# Patient Record
Sex: Female | Born: 1955 | Race: White | Hispanic: Yes | Smoking: Never smoker
Health system: Southern US, Community
[De-identification: ages and names within clinical notes are randomized; demographics above are authoritative.]

## PROBLEM LIST (undated history)

## (undated) DIAGNOSIS — J309 Allergic rhinitis, unspecified: Secondary | ICD-10-CM

## (undated) DIAGNOSIS — E039 Hypothyroidism, unspecified: Secondary | ICD-10-CM

## (undated) DIAGNOSIS — H269 Unspecified cataract: Secondary | ICD-10-CM

## (undated) DIAGNOSIS — I700 Atherosclerosis of aorta: Secondary | ICD-10-CM

## (undated) DIAGNOSIS — M1990 Unspecified osteoarthritis, unspecified site: Secondary | ICD-10-CM

## (undated) DIAGNOSIS — T7840XA Allergy, unspecified, initial encounter: Secondary | ICD-10-CM

## (undated) DIAGNOSIS — I2510 Atherosclerotic heart disease of native coronary artery without angina pectoris: Secondary | ICD-10-CM

## (undated) DIAGNOSIS — E785 Hyperlipidemia, unspecified: Secondary | ICD-10-CM

## (undated) DIAGNOSIS — F32A Depression, unspecified: Secondary | ICD-10-CM

## (undated) DIAGNOSIS — G4730 Sleep apnea, unspecified: Secondary | ICD-10-CM

## (undated) DIAGNOSIS — K219 Gastro-esophageal reflux disease without esophagitis: Secondary | ICD-10-CM

## (undated) DIAGNOSIS — M8580 Other specified disorders of bone density and structure, unspecified site: Secondary | ICD-10-CM

## (undated) DIAGNOSIS — C50919 Malignant neoplasm of unspecified site of unspecified female breast: Secondary | ICD-10-CM

## (undated) HISTORY — DX: Hyperlipidemia, unspecified: E78.5

## (undated) HISTORY — DX: Depression, unspecified: F32.A

## (undated) HISTORY — DX: Allergic rhinitis, unspecified: J30.9

## (undated) HISTORY — PX: BREAST LUMPECTOMY: SHX2

## (undated) HISTORY — PX: OTHER SURGICAL HISTORY: SHX169

## (undated) HISTORY — DX: Atherosclerotic heart disease of native coronary artery without angina pectoris: I25.10

## (undated) HISTORY — DX: Unspecified cataract: H26.9

## (undated) HISTORY — DX: Atherosclerosis of aorta: I70.0

## (undated) HISTORY — DX: Unspecified osteoarthritis, unspecified site: M19.90

## (undated) HISTORY — DX: Allergy, unspecified, initial encounter: T78.40XA

## (undated) HISTORY — DX: Other specified disorders of bone density and structure, unspecified site: M85.80

## (undated) HISTORY — DX: Hypothyroidism, unspecified: E03.9

## (undated) HISTORY — DX: Gastro-esophageal reflux disease without esophagitis: K21.9

## (undated) HISTORY — PX: TONSILLECTOMY: SUR1361

---

## 2019-02-16 DIAGNOSIS — G5791 Unspecified mononeuropathy of right lower limb: Secondary | ICD-10-CM | POA: Diagnosis not present

## 2019-02-16 DIAGNOSIS — M19071 Primary osteoarthritis, right ankle and foot: Secondary | ICD-10-CM | POA: Diagnosis not present

## 2019-05-02 DIAGNOSIS — I8393 Asymptomatic varicose veins of bilateral lower extremities: Secondary | ICD-10-CM | POA: Diagnosis not present

## 2019-05-02 DIAGNOSIS — E039 Hypothyroidism, unspecified: Secondary | ICD-10-CM | POA: Diagnosis not present

## 2019-05-02 DIAGNOSIS — J309 Allergic rhinitis, unspecified: Secondary | ICD-10-CM | POA: Diagnosis not present

## 2019-05-02 DIAGNOSIS — G8929 Other chronic pain: Secondary | ICD-10-CM | POA: Diagnosis not present

## 2019-05-02 DIAGNOSIS — Z0000 Encounter for general adult medical examination without abnormal findings: Secondary | ICD-10-CM | POA: Diagnosis not present

## 2019-05-02 DIAGNOSIS — R69 Illness, unspecified: Secondary | ICD-10-CM | POA: Diagnosis not present

## 2019-05-02 DIAGNOSIS — Z853 Personal history of malignant neoplasm of breast: Secondary | ICD-10-CM | POA: Diagnosis not present

## 2019-05-02 DIAGNOSIS — M25511 Pain in right shoulder: Secondary | ICD-10-CM | POA: Diagnosis not present

## 2019-05-09 ENCOUNTER — Ambulatory Visit (INDEPENDENT_AMBULATORY_CARE_PROVIDER_SITE_OTHER): Payer: Medicare HMO | Admitting: Family Medicine

## 2019-05-09 ENCOUNTER — Other Ambulatory Visit: Payer: Self-pay

## 2019-05-09 ENCOUNTER — Ambulatory Visit: Payer: Self-pay

## 2019-05-09 VITALS — BP 110/80 | HR 65 | Ht 62.0 in | Wt 136.0 lb

## 2019-05-09 DIAGNOSIS — M25512 Pain in left shoulder: Secondary | ICD-10-CM

## 2019-05-09 DIAGNOSIS — M25511 Pain in right shoulder: Secondary | ICD-10-CM | POA: Diagnosis not present

## 2019-05-09 DIAGNOSIS — G8929 Other chronic pain: Secondary | ICD-10-CM | POA: Diagnosis not present

## 2019-05-09 NOTE — Patient Instructions (Signed)
Thank you for coming in today. Plan for PT.  Let me know if you have a problem.  Recheck in 6 weeks.  Return or contact me sooner if needed.  Do the home exercises.   [place].

## 2019-05-09 NOTE — Progress Notes (Signed)
Subjective:    I'm seeing this patient as a consultation for: Kayla Pretty, MD  . Note will be routed back to referring provider/PCP.  CC: bilteral shoulder pain   HPI: Patient is a 64 year old female presenting today with bilateral shoulder pain X1 year. She rates her pain 8/10 and describes the pain as sharp in nature. Patient locates the pain to lateral shoulders. States in the past she has been told that she has had rotator cuff tears.  She has had injections and done physical therapy in the past as well which helped some.  She was doing Mueller well until recently.  Patient moved here from [place] so has been doing a lot of lifting.   Radiating pain: no  R UE numbness/tingling: yes when sleeping   R UE weakness: no  Aggravating factors: raising arm  Treatments tried: Ibuprofen   Past medical history, Surgical history, Family history, Social history, Allergies, and medications have been entered into the medical record, reviewed.   Review of Systems: No new headache, visual changes, nausea, vomiting, diarrhea, constipation, dizziness, abdominal pain, skin rash, fevers, chills, night sweats, weight loss, swollen lymph nodes, body aches, joint swelling, muscle aches, chest pain, shortness of breath, mood changes, visual or auditory hallucinations.   Objective:    Vitals:   05/09/19 1456  BP: 110/80  Pulse: 65  SpO2: 97%   General: Well Developed, well nourished, and in no acute distress.  Neuro/Psych: Alert and oriented x3, extra-ocular muscles intact, able to move all 4 extremities, sensation grossly intact. Skin: Warm and dry, no rashes noted.  Respiratory: Not using accessory muscles, speaking in full sentences, trachea midline.  Cardiovascular: Pulses palpable, no extremity edema. Abdomen: Does not appear distended. MSK:  Right shoulder: Normal-appearing Nontender. Range of motion abduction limited to 100 degrees.  External rotation full.  Internal rotation to lumbar  spine. Strength abduction 5/5 external rotation 5/5 internal rotation 5/5. Positive Hawkins and Neer's test. Negative Yergason's and speeds test.  Left shoulder: Normal-appearing Nontender. Range of motion abduction limited to 110 degrees.  Full external rotation.  Internal rotation limited to lumbar spine. Strength full throughout. Positive Hawkins and Neer's test. Negative Yergason's and speeds test.  Pulses cap refill and sensation are intact distally.  Lab and Radiology Results  Procedure: Real-time Ultrasound Guided Injection of right shoulder subacromial bursa Device: Philips Affiniti 50G Images permanently stored and available for review in the ultrasound unit. Verbal informed consent obtained.  Discussed risks and benefits of procedure. Warned about infection bleeding damage to structures skin hypopigmentation and fat atrophy among others. Patient expresses understanding and agreement Time-out conducted.   Noted no overlying erythema, induration, or other signs of local infection.   Skin prepped in a sterile fashion.   Local anesthesia: Topical Ethyl chloride.   Supraspinatus tendon is intact without full-thickness tear With sterile technique and under real time ultrasound guidance:  40 mg of Kenalog and 2 mL of Marcaine injected easily.   Completed without difficulty   Pain immediately resolved suggesting accurate placement of the medication.   Advised to call if fevers/chills, erythema, induration, drainage, or persistent bleeding.   Images permanently stored and available for review in the ultrasound unit.  Impression: Technically successful ultrasound guided injection.   Procedure: Real-time Ultrasound Guided Injection of left shoulder subacromial bursa Device: Philips Affiniti 50G Images permanently stored and available for review in the ultrasound unit. Verbal informed consent obtained.  Discussed risks and benefits of procedure. Warned about infection bleeding  damage to structures skin hypopigmentation and fat atrophy among others. Patient expresses understanding and agreement Time-out conducted.   Noted no overlying erythema, induration, or other signs of local infection.   Supraspinatus tendon is intact without evidence of full-thickness tear Skin prepped in a sterile fashion.   Local anesthesia: Topical Ethyl chloride.   With sterile technique and under real time ultrasound guidance:  40 mg of Kenalog and 2 mL of Marcaine injected easily.   Completed without difficulty   Pain immediately resolved suggesting accurate placement of the medication.   Advised to call if fevers/chills, erythema, induration, drainage, or persistent bleeding.   Images permanently stored and available for review in the ultrasound unit.  Impression: Technically successful ultrasound guided injection.      Impression and Recommendations:    Assessment and Plan: 64 y.o. female with bilateral shoulder pain due to rotator cuff tendinopathy.  Chronic recurrent problem first time seeing patient for this issue.  Plan for injection as above.  Also will refer back to physical therapy.  She has had some benefit in the past with this and needs effectively a tune up.  Recommend continued ongoing home exercise program as well.  Check back in 6 weeks.  Return sooner if needed.   Orders Placed This Encounter  Procedures  . [place] LIMITED JOINT SPACE STRUCTURES UP BILAT(NO LINKED CHARGES)    Order Specific Question:   Reason for Exam (SYMPTOM  OR DIAGNOSIS REQUIRED)    Answer:   shoulder pain b    Order Specific Question:   Preferred imaging location?    Answer:   [place]  . Ambulatory referral to Physical Therapy    Referral Priority:   Routine    Referral Type:   Physical Medicine    Referral Reason:   Specialty Services Required    Requested Specialty:   Physical Therapy    Number of Visits Requested:   1   No orders of the defined types were  placed in this encounter.   Discussed warning signs or symptoms. Please see discharge instructions. Patient expresses understanding.   The above documentation has been reviewed and is accurate and complete [name]   Will send note to PCP Dr [facility] [address] [place], [place] [address] Phone: [phone] Fax: [phone]

## 2019-05-17 DIAGNOSIS — M25612 Stiffness of left shoulder, not elsewhere classified: Secondary | ICD-10-CM | POA: Diagnosis not present

## 2019-05-17 DIAGNOSIS — M25611 Stiffness of right shoulder, not elsewhere classified: Secondary | ICD-10-CM | POA: Diagnosis not present

## 2019-05-17 DIAGNOSIS — M25511 Pain in right shoulder: Secondary | ICD-10-CM | POA: Diagnosis not present

## 2019-05-17 DIAGNOSIS — M25512 Pain in left shoulder: Secondary | ICD-10-CM | POA: Diagnosis not present

## 2019-05-20 DIAGNOSIS — M62512 Muscle wasting and atrophy, not elsewhere classified, left shoulder: Secondary | ICD-10-CM | POA: Diagnosis not present

## 2019-05-20 DIAGNOSIS — M25512 Pain in left shoulder: Secondary | ICD-10-CM | POA: Diagnosis not present

## 2019-05-20 DIAGNOSIS — M62511 Muscle wasting and atrophy, not elsewhere classified, right shoulder: Secondary | ICD-10-CM | POA: Diagnosis not present

## 2019-05-20 DIAGNOSIS — M25611 Stiffness of right shoulder, not elsewhere classified: Secondary | ICD-10-CM | POA: Diagnosis not present

## 2019-05-20 DIAGNOSIS — M25612 Stiffness of left shoulder, not elsewhere classified: Secondary | ICD-10-CM | POA: Diagnosis not present

## 2019-05-20 DIAGNOSIS — M25511 Pain in right shoulder: Secondary | ICD-10-CM | POA: Diagnosis not present

## 2019-05-24 DIAGNOSIS — M62511 Muscle wasting and atrophy, not elsewhere classified, right shoulder: Secondary | ICD-10-CM | POA: Diagnosis not present

## 2019-05-24 DIAGNOSIS — M25511 Pain in right shoulder: Secondary | ICD-10-CM | POA: Diagnosis not present

## 2019-05-24 DIAGNOSIS — M25612 Stiffness of left shoulder, not elsewhere classified: Secondary | ICD-10-CM | POA: Diagnosis not present

## 2019-05-24 DIAGNOSIS — M25611 Stiffness of right shoulder, not elsewhere classified: Secondary | ICD-10-CM | POA: Diagnosis not present

## 2019-05-24 DIAGNOSIS — M62512 Muscle wasting and atrophy, not elsewhere classified, left shoulder: Secondary | ICD-10-CM | POA: Diagnosis not present

## 2019-05-24 DIAGNOSIS — M25512 Pain in left shoulder: Secondary | ICD-10-CM | POA: Diagnosis not present

## 2019-05-26 DIAGNOSIS — M25512 Pain in left shoulder: Secondary | ICD-10-CM | POA: Diagnosis not present

## 2019-05-26 DIAGNOSIS — M25611 Stiffness of right shoulder, not elsewhere classified: Secondary | ICD-10-CM | POA: Diagnosis not present

## 2019-05-26 DIAGNOSIS — M25612 Stiffness of left shoulder, not elsewhere classified: Secondary | ICD-10-CM | POA: Diagnosis not present

## 2019-05-26 DIAGNOSIS — M62511 Muscle wasting and atrophy, not elsewhere classified, right shoulder: Secondary | ICD-10-CM | POA: Diagnosis not present

## 2019-05-26 DIAGNOSIS — M25511 Pain in right shoulder: Secondary | ICD-10-CM | POA: Diagnosis not present

## 2019-05-26 DIAGNOSIS — M62512 Muscle wasting and atrophy, not elsewhere classified, left shoulder: Secondary | ICD-10-CM | POA: Diagnosis not present

## 2019-05-31 DIAGNOSIS — M25512 Pain in left shoulder: Secondary | ICD-10-CM | POA: Diagnosis not present

## 2019-05-31 DIAGNOSIS — M62511 Muscle wasting and atrophy, not elsewhere classified, right shoulder: Secondary | ICD-10-CM | POA: Diagnosis not present

## 2019-05-31 DIAGNOSIS — M25612 Stiffness of left shoulder, not elsewhere classified: Secondary | ICD-10-CM | POA: Diagnosis not present

## 2019-05-31 DIAGNOSIS — M62512 Muscle wasting and atrophy, not elsewhere classified, left shoulder: Secondary | ICD-10-CM | POA: Diagnosis not present

## 2019-05-31 DIAGNOSIS — M25511 Pain in right shoulder: Secondary | ICD-10-CM | POA: Diagnosis not present

## 2019-05-31 DIAGNOSIS — M25611 Stiffness of right shoulder, not elsewhere classified: Secondary | ICD-10-CM | POA: Diagnosis not present

## 2019-06-07 DIAGNOSIS — M25612 Stiffness of left shoulder, not elsewhere classified: Secondary | ICD-10-CM | POA: Diagnosis not present

## 2019-06-07 DIAGNOSIS — M25512 Pain in left shoulder: Secondary | ICD-10-CM | POA: Diagnosis not present

## 2019-06-07 DIAGNOSIS — M62512 Muscle wasting and atrophy, not elsewhere classified, left shoulder: Secondary | ICD-10-CM | POA: Diagnosis not present

## 2019-06-07 DIAGNOSIS — M25511 Pain in right shoulder: Secondary | ICD-10-CM | POA: Diagnosis not present

## 2019-06-07 DIAGNOSIS — R69 Illness, unspecified: Secondary | ICD-10-CM | POA: Diagnosis not present

## 2019-06-07 DIAGNOSIS — M62511 Muscle wasting and atrophy, not elsewhere classified, right shoulder: Secondary | ICD-10-CM | POA: Diagnosis not present

## 2019-06-07 DIAGNOSIS — M25611 Stiffness of right shoulder, not elsewhere classified: Secondary | ICD-10-CM | POA: Diagnosis not present

## 2019-06-09 DIAGNOSIS — M62512 Muscle wasting and atrophy, not elsewhere classified, left shoulder: Secondary | ICD-10-CM | POA: Diagnosis not present

## 2019-06-09 DIAGNOSIS — M62511 Muscle wasting and atrophy, not elsewhere classified, right shoulder: Secondary | ICD-10-CM | POA: Diagnosis not present

## 2019-06-09 DIAGNOSIS — M25612 Stiffness of left shoulder, not elsewhere classified: Secondary | ICD-10-CM | POA: Diagnosis not present

## 2019-06-09 DIAGNOSIS — M25512 Pain in left shoulder: Secondary | ICD-10-CM | POA: Diagnosis not present

## 2019-06-09 DIAGNOSIS — M25511 Pain in right shoulder: Secondary | ICD-10-CM | POA: Diagnosis not present

## 2019-06-09 DIAGNOSIS — M25611 Stiffness of right shoulder, not elsewhere classified: Secondary | ICD-10-CM | POA: Diagnosis not present

## 2019-06-13 DIAGNOSIS — Z1211 Encounter for screening for malignant neoplasm of colon: Secondary | ICD-10-CM | POA: Diagnosis not present

## 2019-06-13 DIAGNOSIS — Z1212 Encounter for screening for malignant neoplasm of rectum: Secondary | ICD-10-CM | POA: Diagnosis not present

## 2019-06-14 DIAGNOSIS — M62512 Muscle wasting and atrophy, not elsewhere classified, left shoulder: Secondary | ICD-10-CM | POA: Diagnosis not present

## 2019-06-14 DIAGNOSIS — M62511 Muscle wasting and atrophy, not elsewhere classified, right shoulder: Secondary | ICD-10-CM | POA: Diagnosis not present

## 2019-06-14 DIAGNOSIS — M25612 Stiffness of left shoulder, not elsewhere classified: Secondary | ICD-10-CM | POA: Diagnosis not present

## 2019-06-14 DIAGNOSIS — M25611 Stiffness of right shoulder, not elsewhere classified: Secondary | ICD-10-CM | POA: Diagnosis not present

## 2019-06-14 DIAGNOSIS — M25512 Pain in left shoulder: Secondary | ICD-10-CM | POA: Diagnosis not present

## 2019-06-14 DIAGNOSIS — M25511 Pain in right shoulder: Secondary | ICD-10-CM | POA: Diagnosis not present

## 2019-06-20 ENCOUNTER — Ambulatory Visit: Payer: Medicare HMO | Admitting: Family Medicine

## 2019-06-20 DIAGNOSIS — M25612 Stiffness of left shoulder, not elsewhere classified: Secondary | ICD-10-CM | POA: Diagnosis not present

## 2019-06-20 DIAGNOSIS — M25512 Pain in left shoulder: Secondary | ICD-10-CM | POA: Diagnosis not present

## 2019-06-20 DIAGNOSIS — M62511 Muscle wasting and atrophy, not elsewhere classified, right shoulder: Secondary | ICD-10-CM | POA: Diagnosis not present

## 2019-06-20 DIAGNOSIS — M25611 Stiffness of right shoulder, not elsewhere classified: Secondary | ICD-10-CM | POA: Diagnosis not present

## 2019-06-20 DIAGNOSIS — M25511 Pain in right shoulder: Secondary | ICD-10-CM | POA: Diagnosis not present

## 2019-06-20 DIAGNOSIS — M62512 Muscle wasting and atrophy, not elsewhere classified, left shoulder: Secondary | ICD-10-CM | POA: Diagnosis not present

## 2019-06-22 DIAGNOSIS — M25511 Pain in right shoulder: Secondary | ICD-10-CM | POA: Diagnosis not present

## 2019-06-22 DIAGNOSIS — M62512 Muscle wasting and atrophy, not elsewhere classified, left shoulder: Secondary | ICD-10-CM | POA: Diagnosis not present

## 2019-06-22 DIAGNOSIS — M25512 Pain in left shoulder: Secondary | ICD-10-CM | POA: Diagnosis not present

## 2019-06-22 DIAGNOSIS — M25612 Stiffness of left shoulder, not elsewhere classified: Secondary | ICD-10-CM | POA: Diagnosis not present

## 2019-06-22 DIAGNOSIS — M25611 Stiffness of right shoulder, not elsewhere classified: Secondary | ICD-10-CM | POA: Diagnosis not present

## 2019-06-22 DIAGNOSIS — M62511 Muscle wasting and atrophy, not elsewhere classified, right shoulder: Secondary | ICD-10-CM | POA: Diagnosis not present

## 2019-06-24 ENCOUNTER — Other Ambulatory Visit: Payer: Self-pay

## 2019-06-24 ENCOUNTER — Ambulatory Visit: Payer: Medicare HMO | Admitting: Family Medicine

## 2019-06-24 ENCOUNTER — Encounter: Payer: Self-pay | Admitting: Family Medicine

## 2019-06-24 ENCOUNTER — Ambulatory Visit (INDEPENDENT_AMBULATORY_CARE_PROVIDER_SITE_OTHER)
Admission: RE | Admit: 2019-06-24 | Discharge: 2019-06-24 | Disposition: A | Discharge status: Home / Self Care | Payer: Medicare HMO | Source: Ambulatory Visit | Attending: Family Medicine | Admitting: Family Medicine

## 2019-06-24 VITALS — BP 110/72 | HR 68 | Ht 62.0 in | Wt 134.0 lb

## 2019-06-24 DIAGNOSIS — G8929 Other chronic pain: Secondary | ICD-10-CM

## 2019-06-24 DIAGNOSIS — M25512 Pain in left shoulder: Secondary | ICD-10-CM

## 2019-06-24 DIAGNOSIS — M25511 Pain in right shoulder: Secondary | ICD-10-CM | POA: Diagnosis not present

## 2019-06-24 DIAGNOSIS — M19011 Primary osteoarthritis, right shoulder: Secondary | ICD-10-CM | POA: Diagnosis not present

## 2019-06-24 DIAGNOSIS — M19012 Primary osteoarthritis, left shoulder: Secondary | ICD-10-CM | POA: Diagnosis not present

## 2019-06-24 NOTE — Progress Notes (Signed)
X-ray left shoulder shows medium arthritis of the shoulder joint and a little bit of arthritis at the top of the shoulder joint.

## 2019-06-24 NOTE — Progress Notes (Signed)
X-ray right shoulder does show some mild main shoulder joint arthritis and some arthritis at the joint and the top of the shoulder as well.

## 2019-06-24 NOTE — Progress Notes (Signed)
Rito Ehrlich, am serving as a Education administrator for Dr. [name].  Kayla Mueller is a 64 y.o. female who presents to [place] at [facility] today for f/u of B shoulder pain x one year.  She was last seen by Dr. Georgina Snell on 05/09/19 and had B subacromial injections.  She was referred to PT at [facility].  Since her last visit, pt reports she was doing really well, has been going to PT twice a week and it has been really working and the injections from last visit helped a lot. Patient states she has started to feel the pain coming back .  Right shoulder hurts worse than left.   Pertinent review of systems: No fevers or chills  Relevant historical information: Hypothyroidism   Exam:  BP 110/72 (BP Location: Left Arm, Patient Position: Sitting, Cuff Size: Normal)   Pulse 68   Ht 5\' 2"  (1.575 m)   Wt 134 lb (60.8 kg)   SpO2 98%   BMI 24.51 kg/m  General: Well Developed, well nourished, and in no acute distress.   MSK: Right shoulder normal-appearing Nontender. Normal motion. Strength abduction 4+/5.  External rotation 4+/5.  Internal rotation 5/5. Positive Hawkins and Neer's test and positive empty can test. Negative Yergason's and speeds test  Left shoulder normal-appearing Nontender. Normal motion. Intact strength. Negative Hawkins and Neer's test.  Negative empty can test. Negative Yergason's and speeds test.    Lab and Radiology Results  X-ray images bilateral shoulders obtained today personally and independently reviewed  Left shoulder: Moderate AC DJD.  Mild glenohumeral DJD.  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  Minimal glenohumeral DJD.  No acute fractures.  Await formal radiology review    Assessment and Plan: 64 y.o. female with bilateral shoulder pain right worse than left.  Significantly improved following subacromial injection and physical therapy.  Plan to continue physical therapy and home exercise program.   Obtain x-rays today to assess for bony impingement and arthritis and other causes of pain.  Check back in a month if not better would either reinject the shoulders or consider MRI for potential surgical or other injection planning.   PDMP not reviewed this encounter. Orders Placed This Encounter  Procedures  . DG Shoulder Right    Standing Status:   Future    Standing Expiration Date:   06/23/2020    Order Specific Question:   Reason for Exam (SYMPTOM  OR DIAGNOSIS REQUIRED)    Answer:   eval BL shoulder pain    Order Specific Question:   Preferred imaging location?    Answer:   [facility]    Order Specific Question:   Radiology Contrast Protocol - do NOT remove file path    Answer:   \\[email or web address]s.pdf  . DG Shoulder Left    Standing Status:   Future    Standing Expiration Date:   06/23/2020    Order Specific Question:   Reason for Exam (SYMPTOM  OR DIAGNOSIS REQUIRED)    Answer:   eval Bl shoulder pain    Order Specific Question:   Preferred imaging location?    Answer:   [facility]    Order Specific Question:   Radiology Contrast Protocol - do NOT remove file path    Answer:   \\[email or web address]\Radiant\DXFluoroContrastProtocols.pdf   No orders of the defined types were placed in this encounter.    Discussed warning signs or symptoms. Please see discharge instructions. Patient expresses understanding.  The above documentation has been reviewed and is accurate and complete [name], M.D.    Total encounter time 30 minutes including charting time date of service. ddx and next steps

## 2019-06-24 NOTE — Patient Instructions (Signed)
Thank you for coming in today. Plan for xrays.  Continue home exercises and PT.  Recheck in about 1 month.  Can repeat injection if needed.  If just not better could proceed to MRI for surgical planning and potential next step planning.    Shoulder Impingement Syndrome  Shoulder impingement syndrome is a condition that causes pain when connective tissues (tendons) surrounding the shoulder joint become pinched. These tendons are part of the group of muscles and tissues that help to stabilize the shoulder (rotator cuff). Beneath the rotator cuff is a fluid-filled sac (bursa) that allows the muscles and tendons to glide smoothly. The bursa may become swollen or irritated (bursitis). Bursitis, swelling in the rotator cuff tendons, or both conditions can decrease how much space is under a bone in the shoulder joint (acromion), resulting in impingement. What are the causes? Shoulder impingement syndrome may be caused by bursitis or swelling of the rotator cuff tendons, which may result from:  Repetitive overhead arm movements.  Falling onto the shoulder.  Weakness in the shoulder muscles. What increases the risk? You may be more likely to develop this condition if you:  Play sports that involve throwing, such as baseball.  Participate in sports such as tennis, volleyball, and swimming.  Work as a Curator, Games developer, or Architect. Some people are also more likely to develop impingement syndrome because of the shape of their acromion bone. What are the signs or symptoms? The main symptom of this condition is pain on the front or side of the shoulder. The pain may:  Get worse when lifting or raising the arm.  Get worse at night.  Wake you up from sleeping.  Feel sharp when the shoulder is moved and then fade to an ache. Other symptoms may include:  Tenderness.  Stiffness.  Inability to raise the arm above shoulder level or behind the body.  Weakness. How is this  diagnosed? This condition may be diagnosed based on:  Your symptoms and medical history.  A physical exam.  Imaging tests, such as: ? X-rays. ? MRI. ? Ultrasound. How is this treated? This condition may be treated by:  Resting your shoulder and avoiding all activities that cause pain or put stress on the shoulder.  Icing your shoulder.  NSAIDs to help reduce pain and swelling.  One or more injections of medicines to numb the area and reduce inflammation.  Physical therapy.  Surgery. This may be needed if nonsurgical treatments have not helped. Surgery may involve repairing the rotator cuff, reshaping the acromion, or removing the bursa. Follow these instructions at home: Managing pain, stiffness, and swelling   If directed, put ice on the injured area. ? Put ice in a plastic bag. ? Place a towel between your skin and the bag. ? Leave the ice on for 20 minutes, 2-3 times a day. Activity  Rest and return to your normal activities as told by your health care provider. Ask your health care provider what activities are safe for you.  Do exercises as told by your health care provider. General instructions  Do not use any products that contain nicotine or tobacco, such as cigarettes, e-cigarettes, and chewing tobacco. These can delay healing. If you need help quitting, ask your health care provider.  Ask your health care provider when it is safe for you to drive.  Take over-the-counter and prescription medicines only as told by your health care provider.  Keep all follow-up visits as told by your health care provider. This  is important. How is this prevented?  Give your body time to rest between periods of activity.  Be safe and responsible while being active. This will help you avoid falls.  Maintain physical fitness, including strength and flexibility. Contact a health care provider if:  Your symptoms have not improved after 1-2 months of treatment and rest.  You  cannot lift your arm away from your body. Summary  Shoulder impingement syndrome is a condition that causes pain when connective tissues (tendons) surrounding the shoulder joint become pinched.  The main symptom of this condition is pain on the front or side of the shoulder.  This condition is usually treated with rest, ice, and pain medicines as needed. This information is not intended to replace advice given to you by your health care provider. Make sure you discuss any questions you have with your health care provider. Document Revised: 05/14/2018 Document Reviewed: 07/15/2017 Elsevier Patient Education  2020 [employer].

## 2019-06-28 DIAGNOSIS — M25612 Stiffness of left shoulder, not elsewhere classified: Secondary | ICD-10-CM | POA: Diagnosis not present

## 2019-06-28 DIAGNOSIS — M25511 Pain in right shoulder: Secondary | ICD-10-CM | POA: Diagnosis not present

## 2019-06-28 DIAGNOSIS — M25611 Stiffness of right shoulder, not elsewhere classified: Secondary | ICD-10-CM | POA: Diagnosis not present

## 2019-06-28 DIAGNOSIS — M62511 Muscle wasting and atrophy, not elsewhere classified, right shoulder: Secondary | ICD-10-CM | POA: Diagnosis not present

## 2019-06-28 DIAGNOSIS — M25512 Pain in left shoulder: Secondary | ICD-10-CM | POA: Diagnosis not present

## 2019-06-28 DIAGNOSIS — M62512 Muscle wasting and atrophy, not elsewhere classified, left shoulder: Secondary | ICD-10-CM | POA: Diagnosis not present

## 2019-06-30 DIAGNOSIS — M62511 Muscle wasting and atrophy, not elsewhere classified, right shoulder: Secondary | ICD-10-CM | POA: Diagnosis not present

## 2019-06-30 DIAGNOSIS — M25512 Pain in left shoulder: Secondary | ICD-10-CM | POA: Diagnosis not present

## 2019-06-30 DIAGNOSIS — M25612 Stiffness of left shoulder, not elsewhere classified: Secondary | ICD-10-CM | POA: Diagnosis not present

## 2019-06-30 DIAGNOSIS — M25611 Stiffness of right shoulder, not elsewhere classified: Secondary | ICD-10-CM | POA: Diagnosis not present

## 2019-06-30 DIAGNOSIS — M25511 Pain in right shoulder: Secondary | ICD-10-CM | POA: Diagnosis not present

## 2019-06-30 DIAGNOSIS — M62512 Muscle wasting and atrophy, not elsewhere classified, left shoulder: Secondary | ICD-10-CM | POA: Diagnosis not present

## 2019-07-05 DIAGNOSIS — M62511 Muscle wasting and atrophy, not elsewhere classified, right shoulder: Secondary | ICD-10-CM | POA: Diagnosis not present

## 2019-07-05 DIAGNOSIS — M25512 Pain in left shoulder: Secondary | ICD-10-CM | POA: Diagnosis not present

## 2019-07-05 DIAGNOSIS — M62512 Muscle wasting and atrophy, not elsewhere classified, left shoulder: Secondary | ICD-10-CM | POA: Diagnosis not present

## 2019-07-05 DIAGNOSIS — M25612 Stiffness of left shoulder, not elsewhere classified: Secondary | ICD-10-CM | POA: Diagnosis not present

## 2019-07-05 DIAGNOSIS — M25511 Pain in right shoulder: Secondary | ICD-10-CM | POA: Diagnosis not present

## 2019-07-05 DIAGNOSIS — M25611 Stiffness of right shoulder, not elsewhere classified: Secondary | ICD-10-CM | POA: Diagnosis not present

## 2019-07-07 DIAGNOSIS — M62511 Muscle wasting and atrophy, not elsewhere classified, right shoulder: Secondary | ICD-10-CM | POA: Diagnosis not present

## 2019-07-07 DIAGNOSIS — M25612 Stiffness of left shoulder, not elsewhere classified: Secondary | ICD-10-CM | POA: Diagnosis not present

## 2019-07-07 DIAGNOSIS — M62512 Muscle wasting and atrophy, not elsewhere classified, left shoulder: Secondary | ICD-10-CM | POA: Diagnosis not present

## 2019-07-07 DIAGNOSIS — M25611 Stiffness of right shoulder, not elsewhere classified: Secondary | ICD-10-CM | POA: Diagnosis not present

## 2019-07-07 DIAGNOSIS — M25512 Pain in left shoulder: Secondary | ICD-10-CM | POA: Diagnosis not present

## 2019-07-07 DIAGNOSIS — M25511 Pain in right shoulder: Secondary | ICD-10-CM | POA: Diagnosis not present

## 2019-07-15 DIAGNOSIS — M62511 Muscle wasting and atrophy, not elsewhere classified, right shoulder: Secondary | ICD-10-CM | POA: Diagnosis not present

## 2019-07-15 DIAGNOSIS — M25612 Stiffness of left shoulder, not elsewhere classified: Secondary | ICD-10-CM | POA: Diagnosis not present

## 2019-07-15 DIAGNOSIS — M25611 Stiffness of right shoulder, not elsewhere classified: Secondary | ICD-10-CM | POA: Diagnosis not present

## 2019-07-15 DIAGNOSIS — M25511 Pain in right shoulder: Secondary | ICD-10-CM | POA: Diagnosis not present

## 2019-07-15 DIAGNOSIS — M62512 Muscle wasting and atrophy, not elsewhere classified, left shoulder: Secondary | ICD-10-CM | POA: Diagnosis not present

## 2019-07-15 DIAGNOSIS — M25512 Pain in left shoulder: Secondary | ICD-10-CM | POA: Diagnosis not present

## 2019-07-19 DIAGNOSIS — M25612 Stiffness of left shoulder, not elsewhere classified: Secondary | ICD-10-CM | POA: Diagnosis not present

## 2019-07-19 DIAGNOSIS — M25512 Pain in left shoulder: Secondary | ICD-10-CM | POA: Diagnosis not present

## 2019-07-19 DIAGNOSIS — M25511 Pain in right shoulder: Secondary | ICD-10-CM | POA: Diagnosis not present

## 2019-07-19 DIAGNOSIS — M25611 Stiffness of right shoulder, not elsewhere classified: Secondary | ICD-10-CM | POA: Diagnosis not present

## 2019-07-19 DIAGNOSIS — M62512 Muscle wasting and atrophy, not elsewhere classified, left shoulder: Secondary | ICD-10-CM | POA: Diagnosis not present

## 2019-07-19 DIAGNOSIS — M62511 Muscle wasting and atrophy, not elsewhere classified, right shoulder: Secondary | ICD-10-CM | POA: Diagnosis not present

## 2019-07-21 DIAGNOSIS — M62511 Muscle wasting and atrophy, not elsewhere classified, right shoulder: Secondary | ICD-10-CM | POA: Diagnosis not present

## 2019-07-21 DIAGNOSIS — M25512 Pain in left shoulder: Secondary | ICD-10-CM | POA: Diagnosis not present

## 2019-07-21 DIAGNOSIS — M25611 Stiffness of right shoulder, not elsewhere classified: Secondary | ICD-10-CM | POA: Diagnosis not present

## 2019-07-21 DIAGNOSIS — M25612 Stiffness of left shoulder, not elsewhere classified: Secondary | ICD-10-CM | POA: Diagnosis not present

## 2019-07-21 DIAGNOSIS — M25511 Pain in right shoulder: Secondary | ICD-10-CM | POA: Diagnosis not present

## 2019-07-21 DIAGNOSIS — M62512 Muscle wasting and atrophy, not elsewhere classified, left shoulder: Secondary | ICD-10-CM | POA: Diagnosis not present

## 2019-07-25 ENCOUNTER — Ambulatory Visit: Payer: Medicare HMO | Admitting: Family Medicine

## 2019-07-26 DIAGNOSIS — M25611 Stiffness of right shoulder, not elsewhere classified: Secondary | ICD-10-CM | POA: Diagnosis not present

## 2019-07-26 DIAGNOSIS — M62512 Muscle wasting and atrophy, not elsewhere classified, left shoulder: Secondary | ICD-10-CM | POA: Diagnosis not present

## 2019-07-26 DIAGNOSIS — M25511 Pain in right shoulder: Secondary | ICD-10-CM | POA: Diagnosis not present

## 2019-07-26 DIAGNOSIS — M25612 Stiffness of left shoulder, not elsewhere classified: Secondary | ICD-10-CM | POA: Diagnosis not present

## 2019-07-26 DIAGNOSIS — M62511 Muscle wasting and atrophy, not elsewhere classified, right shoulder: Secondary | ICD-10-CM | POA: Diagnosis not present

## 2019-07-26 DIAGNOSIS — M25512 Pain in left shoulder: Secondary | ICD-10-CM | POA: Diagnosis not present

## 2019-07-27 ENCOUNTER — Ambulatory Visit: Payer: Medicare HMO | Admitting: Family Medicine

## 2019-07-29 DIAGNOSIS — M25612 Stiffness of left shoulder, not elsewhere classified: Secondary | ICD-10-CM | POA: Diagnosis not present

## 2019-07-29 DIAGNOSIS — M62512 Muscle wasting and atrophy, not elsewhere classified, left shoulder: Secondary | ICD-10-CM | POA: Diagnosis not present

## 2019-07-29 DIAGNOSIS — M25611 Stiffness of right shoulder, not elsewhere classified: Secondary | ICD-10-CM | POA: Diagnosis not present

## 2019-07-29 DIAGNOSIS — M62511 Muscle wasting and atrophy, not elsewhere classified, right shoulder: Secondary | ICD-10-CM | POA: Diagnosis not present

## 2019-07-29 DIAGNOSIS — M25512 Pain in left shoulder: Secondary | ICD-10-CM | POA: Diagnosis not present

## 2019-07-29 DIAGNOSIS — M25511 Pain in right shoulder: Secondary | ICD-10-CM | POA: Diagnosis not present

## 2019-08-01 ENCOUNTER — Ambulatory Visit: Payer: Medicare HMO | Admitting: Family Medicine

## 2019-08-01 ENCOUNTER — Other Ambulatory Visit: Payer: Self-pay

## 2019-08-01 ENCOUNTER — Ambulatory Visit: Payer: Self-pay

## 2019-08-01 VITALS — BP 120/70 | HR 69 | Ht 62.0 in | Wt 137.6 lb

## 2019-08-01 DIAGNOSIS — G8929 Other chronic pain: Secondary | ICD-10-CM | POA: Diagnosis not present

## 2019-08-01 DIAGNOSIS — M25512 Pain in left shoulder: Secondary | ICD-10-CM | POA: Diagnosis not present

## 2019-08-01 DIAGNOSIS — M25511 Pain in right shoulder: Secondary | ICD-10-CM | POA: Diagnosis not present

## 2019-08-01 NOTE — Patient Instructions (Signed)
Thank you for coming in today. Plan for injection today and MRI right shoulder soon.  After the MRI either schedule with me to go over it or I can refer directly to surgery.   Call or go to the ER if you develop a large red swollen joint with extreme pain or oozing puss.

## 2019-08-01 NOTE — Progress Notes (Signed)
I, Wendy Poet, LAT, ATC, am serving as scribe for Dr. [name].  Kayla Mueller is a 64 y.o. female who presents to [place] at [facility] today for f/u of B shoulder pain, R>L.  She was last seen by Dr. Georgina Snell on 06/24/19 and was advised to con't her outpatient PT and f/u again in one month.  She had prior B subacromial injections on 05/09/19.  Since her last visit, pt reports that her L shoulder is pretty severe but notes that her R and L shoulder are hurting about the same.  She states that she did PT x 2 months and has been discharged.  She is having radiating pain into her B upper arms.  She is right-hand dominant and her right shoulder hurts worse than her left.  Diagnostic imaging: B shoulder XR- 06/24/19  Pertinent review of systems: No fevers or chills  Relevant historical information: Hypothyroidism   Exam:  BP 120/70 (BP Location: Left Arm, Patient Position: Sitting, Cuff Size: Normal)   Pulse 69   Ht 5\' 2"  (1.575 m)   Wt 137 lb 9.6 oz (62.4 kg)   SpO2 98%   BMI 25.17 kg/m  General: Well Developed, well nourished, and in no acute distress.   MSK:  Right shoulder normal-appearing Normal motion painful arc. Strength 4/5 external rotation 5/5 abduction internal rotation. Positive empty can test Hawkins and Neer's test. Negative Yergason's and speeds test.  Right shoulder normal-appearing Normal motion painful arc. Strength 5/5 abduction external and internal rotation. Positive empty can test Hawkins and Neer's test. Negative Yergason's and speeds test.    Lab and Radiology Results  Procedure: Real-time Ultrasound Guided Injection of right shoulder subacromial bursa Device: Philips Affiniti 50G Images permanently stored and available for review in the ultrasound unit. Verbal informed consent obtained.  Discussed risks and benefits of procedure. Warned about infection bleeding damage to structures skin hypopigmentation and fat atrophy among  others. Patient expresses understanding and agreement Time-out conducted.   Noted no overlying erythema, induration, or other signs of local infection.   Skin prepped in a sterile fashion.   Local anesthesia: Topical Ethyl chloride.   With sterile technique and under real time ultrasound guidance:  40 mg of Kenalog and 2 mL of Marcaine injected easily.   Completed without difficulty   Pain immediately resolved suggesting accurate placement of the medication.   Advised to call if fevers/chills, erythema, induration, drainage, or persistent bleeding.   Images permanently stored and available for review in the ultrasound unit.  Impression: Technically successful ultrasound guided injection.   Procedure: Real-time Ultrasound Guided Injection of left shoulder subacromial bursa Device: Philips Affiniti 50G Images permanently stored and available for review in the ultrasound unit. Verbal informed consent obtained.  Discussed risks and benefits of procedure. Warned about infection bleeding damage to structures skin hypopigmentation and fat atrophy among others. Patient expresses understanding and agreement Time-out conducted.   Noted no overlying erythema, induration, or other signs of local infection.   Skin prepped in a sterile fashion.   Local anesthesia: Topical Ethyl chloride.   With sterile technique and under real time ultrasound guidance:  40 mg of Kenalog and 2 mL of Marcaine injected easily.   Completed without difficulty   Pain immediately resolved suggesting accurate placement of the medication.   Advised to call if fevers/chills, erythema, induration, drainage, or persistent bleeding.   Images permanently stored and available for review in the ultrasound unit.  Impression: Technically successful ultrasound guided injection.  Assessment and Plan: 64 y.o. female with bilateral shoulder pain right worse than left failing conservative management including home exercise  program and injection.  At this point I doubtful that injections are going to provide very lasting benefit.  I think she is approaching surgery.  Discussed potential surgical options.  Plan for MRI right shoulder to evaluate for surgery.  Following MRI either return to clinic to discuss MRI or will refer directly to orthopedic surgery.  Consider left shoulder MRI if needed.   PDMP not reviewed this encounter. Orders Placed This Encounter  Procedures  . [place] LIMITED JOINT SPACE STRUCTURES UP BILAT(NO LINKED CHARGES)    Order Specific Question:   Reason for Exam (SYMPTOM  OR DIAGNOSIS REQUIRED)    Answer:   B shoulder pain    Order Specific Question:   Preferred imaging location?    Answer:   [place]  . MR SHOULDER RIGHT WO CONTRAST    Standing Status:   Future    Standing Expiration Date:   07/31/2020    Order Specific Question:   What is the patient's sedation requirement?    Answer:   No Sedation    Order Specific Question:   Does the patient have a pacemaker or implanted devices?    Answer:   No    Order Specific Question:   Preferred imaging location?    Answer:   [facility] (table limit-350lbs)    Order Specific Question:   Radiology Contrast Protocol - do NOT remove file path    Answer:   \\[email or web address]\Radiant\[email or web address]   No orders of the defined types were placed in this encounter.    Discussed warning signs or symptoms. Please see discharge instructions. Patient expresses understanding.   The above documentation has been reviewed and is accurate and complete [name], M.D.

## 2019-08-04 NOTE — Addendum Note (Signed)
Addended by: Pollyann Glen on: 08/04/2019 10:38 AM   Modules accepted: Orders

## 2019-08-12 DIAGNOSIS — R69 Illness, unspecified: Secondary | ICD-10-CM | POA: Diagnosis not present

## 2019-08-26 DIAGNOSIS — R69 Illness, unspecified: Secondary | ICD-10-CM | POA: Diagnosis not present

## 2019-08-26 DIAGNOSIS — G8929 Other chronic pain: Secondary | ICD-10-CM | POA: Diagnosis not present

## 2019-08-26 DIAGNOSIS — E039 Hypothyroidism, unspecified: Secondary | ICD-10-CM | POA: Diagnosis not present

## 2019-08-26 DIAGNOSIS — Z79899 Other long term (current) drug therapy: Secondary | ICD-10-CM | POA: Diagnosis not present

## 2019-08-26 DIAGNOSIS — R7301 Impaired fasting glucose: Secondary | ICD-10-CM | POA: Diagnosis not present

## 2019-08-26 DIAGNOSIS — R5383 Other fatigue: Secondary | ICD-10-CM | POA: Diagnosis not present

## 2019-09-08 ENCOUNTER — Other Ambulatory Visit: Payer: Medicare HMO

## 2019-09-08 ENCOUNTER — Ambulatory Visit
Admission: RE | Admit: 2019-09-08 | Discharge: 2019-09-08 | Disposition: A | Discharge status: Home / Self Care | Payer: Medicare HMO | Source: Ambulatory Visit | Attending: Family Medicine | Admitting: Family Medicine

## 2019-09-08 ENCOUNTER — Other Ambulatory Visit: Payer: Self-pay

## 2019-09-08 DIAGNOSIS — M19011 Primary osteoarthritis, right shoulder: Secondary | ICD-10-CM | POA: Diagnosis not present

## 2019-09-08 DIAGNOSIS — M7551 Bursitis of right shoulder: Secondary | ICD-10-CM | POA: Diagnosis not present

## 2019-09-08 DIAGNOSIS — M25512 Pain in left shoulder: Secondary | ICD-10-CM

## 2019-09-08 DIAGNOSIS — M25411 Effusion, right shoulder: Secondary | ICD-10-CM | POA: Diagnosis not present

## 2019-09-08 DIAGNOSIS — S43431A Superior glenoid labrum lesion of right shoulder, initial encounter: Secondary | ICD-10-CM | POA: Diagnosis not present

## 2019-09-12 NOTE — Progress Notes (Signed)
MRI shoulder shows medium tendinitis of 2 of the rotator cuff tendons.  Additionally it shows mild tendinitis of the biceps tendon.  There is area of more severe than appearing on x-ray arthritis of the glenohumeral joint in the shoulder.  Additionally there are some small labral tears.  Recommend return to clinic to discuss these results in full detail.  Could refer directly to orthopedic surgery if desired.

## 2019-09-15 ENCOUNTER — Other Ambulatory Visit: Payer: Self-pay

## 2019-09-15 ENCOUNTER — Ambulatory Visit (INDEPENDENT_AMBULATORY_CARE_PROVIDER_SITE_OTHER): Payer: Medicare HMO | Admitting: Family Medicine

## 2019-09-15 VITALS — BP 102/64 | HR 89 | Ht 62.0 in | Wt 139.4 lb

## 2019-09-15 DIAGNOSIS — G8929 Other chronic pain: Secondary | ICD-10-CM

## 2019-09-15 DIAGNOSIS — M25511 Pain in right shoulder: Secondary | ICD-10-CM | POA: Diagnosis not present

## 2019-09-15 DIAGNOSIS — M25512 Pain in left shoulder: Secondary | ICD-10-CM

## 2019-09-15 NOTE — Patient Instructions (Signed)
Thank you for coming in today. Plan for surgical consultation.  I can do injections in future if no surgery is planned for now.

## 2019-09-15 NOTE — Progress Notes (Signed)
I, Wendy Poet, LAT, ATC, am serving as scribe for Dr. [name].  Kayla Mueller is a 64 y.o. female who presents to [place] at [facility] today for f/u of B shoulder pain, R>L, and R shoulder MRI review.  She was last seen by Dr. Georgina Snell on 08/01/19 and had B subacromial injections.  She has completed PT and been discharged.  Since her last visit, pt reports that her shoulders felt much improved after her B subacromial injections until a few days ago when the pain returned.  Left shoulder still feeling mostly pain-free.  Diagnostic imaging: R shoulder MRI- 09/08/19; B shoulder XR- 06/24/19  Pertinent review of systems: No fevers or chills  Relevant historical information: Hypothyroidism   Exam:  BP 102/64 (BP Location: Right Arm, Patient Position: Sitting, Cuff Size: Normal)   Pulse 89   Ht 5\' 2"  (1.575 m)   Wt 139 lb 6.4 oz (63.2 kg)   SpO2 98%   BMI 25.50 kg/m  General: Well Developed, well nourished, and in no acute distress.   MSK: Right shoulder normal-appearing normal motion.    Lab and Radiology Results  EXAM: MRI OF THE RIGHT SHOULDER WITHOUT CONTRAST  TECHNIQUE: Multiplanar, multisequence MR imaging of the shoulder was performed. No intravenous contrast was administered.  COMPARISON:  None.  FINDINGS: Rotator cuff: Moderate tendinosis of the supraspinatus and infraspinatus tendons with subcortical reactive marrow changes. Teres minor tendon is intact. Subscapularis tendon is intact.  Muscles: No muscle atrophy or edema. No intramuscular fluid collection or hematoma.  Biceps Long Head: Mild tendinosis of the intra-articular portion of the long head of the biceps tendon.  Acromioclavicular Joint: Moderate arthropathy of the acromioclavicular joint. Type I acromion. Trace subacromial/subdeltoid bursal fluid.  Glenohumeral Joint: Small joint effusion. High-grade partial-thickness cartilage loss with areas of  full-thickness cartilage loss of the glenohumeral joint. Inferior humeral marginal osteophyte.  Labrum: Superior labral degeneration with a posterosuperior labral tear.  Bones: No fracture or dislocation. No aggressive osseous lesion.  Other: No fluid collection or hematoma.  IMPRESSION: 1. Moderate tendinosis of the supraspinatus and infraspinatus tendons. 2. Mild tendinosis of the intra-articular portion of the long head of the biceps tendon. 3. High-grade partial-thickness cartilage loss with areas of full-thickness cartilage loss of the glenohumeral joint consistent with osteoarthritis. 4. Superior labral degeneration with a posterosuperior labral tear.   Electronically Signed   By: Kathreen Devoid   On: 09/09/2019 07:38 I, [name], personally (independently) visualized and performed the interpretation of the images attached in this note.     Assessment and Plan: 64 y.o. female with right shoulder pain.  Multifactorial.  Patient certainly does have tendinopathy likely secondary to impingement.  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  I believe the tendinopathy is the majority of her pain.  She had great response to subacromial injection which did not really make much of a difference for glenohumeral DJD.  Given her failure to improve conservatively I think it is reasonable to have a surgical consultation for a possibility of subacromial decompression.  Ultimately she likely will need a total shoulder replacement but I think it is worthwhile does have a conversation with surgery now.  Happy to provide injections as needed in the future.    Orders Placed This Encounter  Procedures  . Ambulatory referral to Orthopedic Surgery    Referral Priority:   Routine    Referral Type:   Surgical    Referral Reason:   Specialty Services Required  Requested Specialty:   Orthopedic Surgery    Number of Visits Requested:   1   No orders of the defined types were placed in this  encounter.    Discussed warning signs or symptoms. Please see discharge instructions. Patient expresses understanding.   The above documentation has been reviewed and is accurate and complete [name], M.D.  Total encounter time 20 minutes including charting time date of service.

## 2019-09-28 ENCOUNTER — Ambulatory Visit: Payer: Medicare HMO | Admitting: Orthopaedic Surgery

## 2019-09-28 DIAGNOSIS — M7541 Impingement syndrome of right shoulder: Secondary | ICD-10-CM | POA: Diagnosis not present

## 2019-09-28 DIAGNOSIS — M19011 Primary osteoarthritis, right shoulder: Secondary | ICD-10-CM

## 2019-09-28 MED ORDER — LIDOCAINE HCL 1 % IJ SOLN
3.0000 mL | INTRAMUSCULAR | Status: AC | PRN
  Administered 2019-09-28: 3 mL

## 2019-09-28 MED ORDER — METHYLPREDNISOLONE ACETATE 40 MG/ML IJ SUSP
40.0000 mg | INTRAMUSCULAR | Status: AC | PRN
  Administered 2019-09-28: 40 mg via INTRA_ARTICULAR

## 2019-09-28 NOTE — Progress Notes (Signed)
Office Visit Note   Patient: Kayla Mueller           Date of Birth: 08/30/1955           MRN: [id number] Visit Date: 09/28/2019              Requested by: Gregor Hams, MD [place],  [place] [id number] PCP: Deland Pretty, MD   Assessment & Plan: Visit Diagnoses:  1. Impingement syndrome of right shoulder   2. Primary osteoarthritis, right shoulder     Plan: I showed the patient a shoulder model and described was going on with her shoulder.  She is more interested in any surgical intervention such as a arthroscopic intervention I agree with this as well based on her motion I am not feeling any grinding of the glenohumeral joint.  I do feel that she needs a subacromial decompression with a distal clavicle resection and debridement.  I explained in detail what the surgery involves as well as the risk and benefits of surgery.  I talked about her interoperative and postoperative course.  Since she is wanting to delay surgery till after 1 October, I felt was reasonable to try a steroid injection again today in the subacromial space just for comfort purposes and this will help her on vacation.  She agreed to this as well and did request an injection.  We will work on getting her set up for surgery after October 1 and we will be in touch.  Follow-Up Instructions: Return for 1 week post-op.   Orders:  Orders Placed This Encounter  Procedures  . Large Joint Inj   No orders of the defined types were placed in this encounter.     Procedures: Large Joint Inj: R subacromial bursa on 09/28/2019 2:50 PM Indications: pain and diagnostic evaluation Details: 22 G 1.5 in needle  Arthrogram: No  Medications: 3 mL lidocaine 1 %; 40 mg methylPREDNISolone acetate 40 MG/ML Outcome: tolerated well, no immediate complications Procedure, treatment alternatives, risks and benefits explained, specific risks discussed. Consent was given by the patient. Immediately prior to procedure a  time out was called to verify the correct patient, procedure, equipment, support staff and site/side marked as required. Patient was prepped and draped in the usual sterile fashion.       Clinical Data: No additional findings.   Subjective: Chief Complaint  Patient presents with  . Right Shoulder - Pain  The patient is a very pleasant 64 year old female who was sent from Dr. [name] to evaluate and treat severe impingement syndrome of the right shoulder as well as some moderate glenohumeral arthritic changes.  Dr. Georgina Snell has been able to at least provide to steroid injections in the right shoulder subacromial area that used to help quite a bit but now has not been as much helpful with the last one helping fall about a month.  She does have a MRI of her shoulder and I will go over that with her today but she does know the results showing significant stenosis of the rotator cuff as well as moderate arthropathy of the [facility] joint and moderate glenohumeral arthritic changes.  She is otherwise a healthy individual.  She does report some shoulder stiffness but not a lot of weakness.  She is not interested in a shoulder replacement but wants [place] to consider an arthroscopic intervention.  She does have an upcoming vacation and is interested in surgery after October 1 if it is indicated.  She is  not a diabetic and not a smoker and has had no other acute change in her medical status.  She denies any injury to the right shoulder and has never had surgery on that shoulder.  Again she has now had at least 2 steroid injections in the shoulder.  HPI  Review of Systems She currently denies any headache, chest pain, shortness of breath, fever, chills, nausea, vomiting  Objective: Vital Signs: There were no vitals taken for this visit.  Physical Exam She is alert and orient x3 and in no acute distress Ortho Exam Examination of her right shoulder does show a painful arc of motion past 90 degrees of abduction  but she can fully abduct and forward flex the shoulder.  Her external rotation is almost full but her internal rotation with adduction is only reaching to behind her at the lower lumbar level where she can raise much higher reaching behind her with her left shoulder.  She does show positive Neer and Hawkins signs as well. Specialty Comments:  No specialty comments available.  Imaging: No results found. X-rays of the right shoulder as well as the MRI is reviewed.  I feel that her glenohumeral arthritis is not as severe as it is on the MRI but the plain films did not show that.  Most of her clinical exam points to her [facility] joint changes with arthritis as well as the impingement syndrome.  There is significant thickening of the rotator cuff showing tendinosis but no tear.  PMFS History: There are no problems to display for this patient.  No past medical history on file.  No family history on file.  No past surgical history on file. Social History   Occupational History  . Not on file  Tobacco Use  . Smoking status: Not on file  Substance and Sexual Activity  . Alcohol use: Not on file  . Drug use: Not on file  . Sexual activity: Not on file

## 2019-09-29 DIAGNOSIS — R69 Illness, unspecified: Secondary | ICD-10-CM | POA: Diagnosis not present

## 2019-10-06 ENCOUNTER — Ambulatory Visit: Payer: Medicare HMO | Admitting: Family Medicine

## 2019-10-14 DIAGNOSIS — R69 Illness, unspecified: Secondary | ICD-10-CM | POA: Diagnosis not present

## 2019-11-08 DIAGNOSIS — R3589 Other polyuria: Secondary | ICD-10-CM | POA: Diagnosis not present

## 2019-11-08 DIAGNOSIS — N390 Urinary tract infection, site not specified: Secondary | ICD-10-CM | POA: Diagnosis not present

## 2019-11-14 ENCOUNTER — Telehealth: Payer: Self-pay

## 2019-11-14 NOTE — Telephone Encounter (Signed)
Patient called and cancelled shoulder surgery on 11/24/19.  She stated she had been exercising and shoulder is feeling better.  I advised her to call back if she wished to rescheduled.

## 2019-12-01 ENCOUNTER — Inpatient Hospital Stay: Payer: Medicare HMO | Admitting: Orthopaedic Surgery

## 2019-12-23 DIAGNOSIS — H6983 Other specified disorders of Eustachian tube, bilateral: Secondary | ICD-10-CM | POA: Diagnosis not present

## 2019-12-23 DIAGNOSIS — Z1152 Encounter for screening for COVID-19: Secondary | ICD-10-CM | POA: Diagnosis not present

## 2019-12-23 DIAGNOSIS — R051 Acute cough: Secondary | ICD-10-CM | POA: Diagnosis not present

## 2020-01-16 DIAGNOSIS — N760 Acute vaginitis: Secondary | ICD-10-CM | POA: Diagnosis not present

## 2020-01-16 DIAGNOSIS — Z6827 Body mass index (BMI) 27.0-27.9, adult: Secondary | ICD-10-CM | POA: Diagnosis not present

## 2020-01-16 DIAGNOSIS — Z01419 Encounter for gynecological examination (general) (routine) without abnormal findings: Secondary | ICD-10-CM | POA: Diagnosis not present

## 2020-01-16 DIAGNOSIS — Z853 Personal history of malignant neoplasm of breast: Secondary | ICD-10-CM | POA: Diagnosis not present

## 2020-01-16 DIAGNOSIS — N952 Postmenopausal atrophic vaginitis: Secondary | ICD-10-CM | POA: Diagnosis not present

## 2020-02-10 ENCOUNTER — Other Ambulatory Visit: Payer: Self-pay | Admitting: Family Medicine

## 2020-02-10 ENCOUNTER — Other Ambulatory Visit: Payer: Self-pay | Admitting: Internal Medicine

## 2020-02-10 DIAGNOSIS — Z1231 Encounter for screening mammogram for malignant neoplasm of breast: Secondary | ICD-10-CM

## 2020-03-21 ENCOUNTER — Other Ambulatory Visit: Payer: Self-pay

## 2020-03-21 ENCOUNTER — Ambulatory Visit
Admission: RE | Admit: 2020-03-21 | Discharge: 2020-03-21 | Disposition: A | Discharge status: Home / Self Care | Payer: Medicare HMO | Source: Ambulatory Visit | Attending: Internal Medicine | Admitting: Internal Medicine

## 2020-03-21 DIAGNOSIS — Z1231 Encounter for screening mammogram for malignant neoplasm of breast: Secondary | ICD-10-CM

## 2020-03-21 HISTORY — DX: Malignant neoplasm of unspecified site of unspecified female breast: C50.919

## 2020-04-02 NOTE — Progress Notes (Signed)
I, Wendy Poet, LAT, ATC, am serving as scribe for Dr. [name].  Kayla Mueller is a 65 y.o. female who presents to [place] at [facility] today for shoulder pain. Pt was last seen by Dr. Georgina Snell on 09/15/19 for bilat shoulder pain and was advised to seek surgical consultation. Pt was scheduled to have shoulder surgery on 11/24/19, however called and canceled it due to some family issues at that time. She had prior B subacromial injections on 08/01/19 and has completed a course of PT.  Today, pt reports that her B shoulder pain (R>L) has been flared up for months but has recently worsened over the past few weeks.  She feels this is due to helping her handicapped sister and helping her w/ sit-to-stand transfers.  She states that she feels like she is ready to see the orthopedist again and is ready to do surgery if this is still recommended.  She is getting some referred pain into her R upper arm.  She is also reporting that her R hand/fingers are going numb when driving.  B shoulder AROM is decreased, R >L.  Pt is interested in getting B shoulder injections today.  Dx imaging: 09/08/19 R shoulder MRI  06/24/19 R & L shoulder XR  Pertinent review of systems: No fevers or chills  Relevant historical information: Ankle pain   Exam:  BP 102/68 (BP Location: Left Arm, Patient Position: Sitting, Cuff Size: Normal)   Pulse 76   Ht 5\' 2"  (1.575 m)   Wt 153 lb 12.8 oz (69.8 kg)   SpO2 92%   BMI 28.13 kg/m  General: Well Developed, well nourished, and in no acute distress.   MSK: Right shoulder normal-appearing Range of motion decreased abduction.  Intact strength within limits of motion.  Positive Hawkins and Neer's test.  Left shoulder normal appearing Decreased range of motion to abduction. Intact strength. Positive Hawkins and Neer's test.    Lab and Radiology Results  Procedure: Real-time Ultrasound Guided Injection of right shoulder subacromial bursa Device:  Philips Affiniti 50G Images permanently stored and available for review in PACS Verbal informed consent obtained.  Discussed risks and benefits of procedure. Warned about infection bleeding damage to structures skin hypopigmentation and fat atrophy among others. Patient expresses understanding and agreement Time-out conducted.   Noted no overlying erythema, induration, or other signs of local infection.   Skin prepped in a sterile fashion.   Local anesthesia: Topical Ethyl chloride.   With sterile technique and under real time ultrasound guidance:  40 mg of Kenalog and 2 mL of Marcaine injected into right subacromial bursa. Fluid seen entering the bursa.   Completed without difficulty   Pain immediately resolved suggesting accurate placement of the medication.   Advised to call if fevers/chills, erythema, induration, drainage, or persistent bleeding.   Images permanently stored and available for review in the ultrasound unit.  Impression: Technically successful ultrasound guided injection.    Procedure: Real-time Ultrasound Guided Injection of left shoulder subacromial bursa Device: Philips Affiniti 50G Images permanently stored and available for review in PACS Verbal informed consent obtained.  Discussed risks and benefits of procedure. Warned about infection bleeding damage to structures skin hypopigmentation and fat atrophy among others. Patient expresses understanding and agreement Time-out conducted.   Noted no overlying erythema, induration, or other signs of local infection.   Skin prepped in a sterile fashion.   Local anesthesia: Topical Ethyl chloride.   With sterile technique and under real time ultrasound guidance:  40 mg of Kenalog and 2 mL of Marcaine injected into subacromial bursa. Fluid seen entering the bursa.   Completed without difficulty   Pain immediately resolved suggesting accurate placement of the medication.   Advised to call if fevers/chills, erythema,  induration, drainage, or persistent bleeding.   Images permanently stored and available for review in the ultrasound unit.  Impression: Technically successful ultrasound guided injection.      Assessment and Plan: 65 y.o. female with bilateral shoulder pain.  Pain thought to be due to rotator cuff tendinopathy and subacromial bursitis.  Plan for repeat steroid injection.  If needed can refer back to orthopedic surgery to proceed with planned surgery.  Continue home exercise program as previously taught.   PDMP not reviewed this encounter. Orders Placed This Encounter  Procedures  . [place] LIMITED JOINT SPACE STRUCTURES UP BILAT(NO LINKED CHARGES)    Order Specific Question:   Reason for Exam (SYMPTOM  OR DIAGNOSIS REQUIRED)    Answer:   B shoulder pain    Order Specific Question:   Preferred imaging location?    Answer:   [facility]   No orders of the defined types were placed in this encounter.    Discussed warning signs or symptoms. Please see discharge instructions. Patient expresses understanding.   The above documentation has been reviewed and is accurate and complete [name], M.D.

## 2020-04-03 ENCOUNTER — Ambulatory Visit: Payer: Medicare HMO | Admitting: Family Medicine

## 2020-04-03 ENCOUNTER — Other Ambulatory Visit: Payer: Self-pay

## 2020-04-03 ENCOUNTER — Ambulatory Visit: Payer: Self-pay

## 2020-04-03 VITALS — BP 102/68 | HR 76 | Ht 62.0 in | Wt 153.8 lb

## 2020-04-03 DIAGNOSIS — M25511 Pain in right shoulder: Secondary | ICD-10-CM | POA: Diagnosis not present

## 2020-04-03 DIAGNOSIS — G8929 Other chronic pain: Secondary | ICD-10-CM | POA: Diagnosis not present

## 2020-04-03 DIAGNOSIS — M25512 Pain in left shoulder: Secondary | ICD-10-CM

## 2020-04-03 NOTE — Patient Instructions (Signed)
Thank you for coming in today.  Call or go to the ER if you develop a large red swollen joint with extreme pain or oozing puss.   If not improving let me know and I can refer you back to Dr Ninfa Linden to reconsider surgery.    Shoulder Impingement Syndrome Rehab Ask your health care provider which exercises are safe for you. Do exercises exactly as told by your health care provider and adjust them as directed. It is normal to feel mild stretching, pulling, tightness, or discomfort as you do these exercises. Stop right away if you feel sudden pain or your pain gets worse. Do not begin these exercises until told by your health care provider. Stretching and range-of-motion exercise This exercise warms up your muscles and joints and improves the movement and flexibility of your shoulder. This exercise also helps to relieve pain and stiffness. Passive horizontal adduction In passive adduction, you use your other hand to move the injured arm toward your body. The injured arm does not move on its own. In this movement, your arm is moved across your body in the horizontal plane (horizontal adduction). 1. Sit or stand and pull your left / right elbow across your chest, toward your other shoulder. Stop when you feel a gentle stretch in the back of your shoulder and upper arm. ? Keep your arm at shoulder height. ? Keep your arm as close to your body as you comfortably can. 2. Hold for __________ seconds. 3. Slowly return to the starting position. Repeat __________ times. Complete this exercise __________ times a day.   Strengthening exercises These exercises build strength and endurance in your shoulder. Endurance is the ability to use your muscles for a long time, even after they get tired. External rotation, isometric This is an exercise in which you press the back of your wrist against a door frame without moving your shoulder joint (isometric). 1. Stand or sit in a doorway, facing the door  frame. 2. Bend your left / right elbow and place the back of your wrist against the door frame. Only the back of your wrist should be touching the frame. Keep your upper arm at your side. 3. Gently press your wrist against the door frame, as if you are trying to push your arm away from your abdomen (external rotation). Press as hard as you are able without pain. ? Avoid shrugging your shoulder while you press your wrist against the door frame. Keep your shoulder blade tucked down toward the middle of your back. 4. Hold for __________ seconds. 5. Slowly release the tension, and relax your muscles completely before you repeat the exercise. Repeat __________ times. Complete this exercise __________ times a day. Internal rotation, isometric This is an exercise in which you press your palm against a door frame without moving your shoulder joint (isometric). 1. Stand or sit in a doorway, facing the door frame. 2. Bend your left / right elbow and place the palm of your hand against the door frame. Only your palm should be touching the frame. Keep your upper arm at your side. 3. Gently press your hand against the door frame, as if you are trying to push your arm toward your abdomen (internal rotation). Press as hard as you are able without pain. ? Avoid shrugging your shoulder while you press your hand against the door frame. Keep your shoulder blade tucked down toward the middle of your back. 4. Hold for __________ seconds. 5. Slowly release the tension, and  relax your muscles completely before you repeat the exercise. Repeat __________ times. Complete this exercise __________ times a day.   Scapular protraction, supine 1. Lie on your back on a firm surface (supine position). Hold a __________ weight in your left / right hand. 2. Raise your left / right arm straight into the air so your hand is directly above your shoulder joint. 3. Push the weight into the air so your shoulder (scapula) lifts off the  surface that you are lying on. The scapula will push up or forward (protraction). Do not move your head, neck, or back. 4. Hold for __________ seconds. 5. Slowly return to the starting position. Let your muscles relax completely before you repeat this exercise. Repeat __________ times. Complete this exercise __________ times a day.   Scapular retraction 1. Sit in a stable chair without armrests, or stand up. 2. Secure an exercise band to a stable object in front of you so the band is at shoulder height. 3. Hold one end of the exercise band in each hand. Your palms should face down. 4. Squeeze your shoulder blades together (retraction) and move your elbows slightly behind you. Do not shrug your shoulders upward while you do this. 5. Hold for __________ seconds. 6. Slowly return to the starting position. Repeat __________ times. Complete this exercise __________ times a day.   Shoulder extension 1. Sit in a stable chair without armrests, or stand up. 2. Secure an exercise band to a stable object in front of you so the band is above shoulder height. 3. Hold one end of the exercise band in each hand. 4. Straighten your elbows and lift your hands up to shoulder height. 5. Squeeze your shoulder blades together and pull your hands down to the sides of your thighs (extension). Stop when your hands are straight down by your sides. Do not let your hands go behind your body. 6. Hold for __________ seconds. 7. Slowly return to the starting position. Repeat __________ times. Complete this exercise __________ times a day.   This information is not intended to replace advice given to you by your health care provider. Make sure you discuss any questions you have with your health care provider. Document Revised: 05/14/2018 Document Reviewed: 02/15/2018 Elsevier Patient Education  2021 [employer].

## 2020-04-12 DIAGNOSIS — Z1152 Encounter for screening for COVID-19: Secondary | ICD-10-CM | POA: Diagnosis not present

## 2020-04-12 DIAGNOSIS — J209 Acute bronchitis, unspecified: Secondary | ICD-10-CM | POA: Diagnosis not present

## 2020-04-12 DIAGNOSIS — R059 Cough, unspecified: Secondary | ICD-10-CM | POA: Diagnosis not present

## 2020-04-25 DIAGNOSIS — H539 Unspecified visual disturbance: Secondary | ICD-10-CM | POA: Diagnosis not present

## 2020-04-25 DIAGNOSIS — H9319 Tinnitus, unspecified ear: Secondary | ICD-10-CM | POA: Diagnosis not present

## 2020-04-25 DIAGNOSIS — R69 Illness, unspecified: Secondary | ICD-10-CM | POA: Diagnosis not present

## 2020-05-01 DIAGNOSIS — N390 Urinary tract infection, site not specified: Secondary | ICD-10-CM | POA: Diagnosis not present

## 2020-05-01 DIAGNOSIS — E039 Hypothyroidism, unspecified: Secondary | ICD-10-CM | POA: Diagnosis not present

## 2020-05-01 DIAGNOSIS — E7800 Pure hypercholesterolemia, unspecified: Secondary | ICD-10-CM | POA: Diagnosis not present

## 2020-05-16 ENCOUNTER — Other Ambulatory Visit: Payer: Self-pay | Admitting: Internal Medicine

## 2020-05-16 DIAGNOSIS — M25511 Pain in right shoulder: Secondary | ICD-10-CM | POA: Diagnosis not present

## 2020-05-16 DIAGNOSIS — E7800 Pure hypercholesterolemia, unspecified: Secondary | ICD-10-CM

## 2020-05-16 DIAGNOSIS — Z0000 Encounter for general adult medical examination without abnormal findings: Secondary | ICD-10-CM | POA: Diagnosis not present

## 2020-05-16 DIAGNOSIS — Z853 Personal history of malignant neoplasm of breast: Secondary | ICD-10-CM | POA: Diagnosis not present

## 2020-05-16 DIAGNOSIS — H9319 Tinnitus, unspecified ear: Secondary | ICD-10-CM | POA: Diagnosis not present

## 2020-05-16 DIAGNOSIS — R7303 Prediabetes: Secondary | ICD-10-CM | POA: Diagnosis not present

## 2020-05-16 DIAGNOSIS — Z8632 Personal history of gestational diabetes: Secondary | ICD-10-CM | POA: Diagnosis not present

## 2020-05-16 DIAGNOSIS — L918 Other hypertrophic disorders of the skin: Secondary | ICD-10-CM | POA: Diagnosis not present

## 2020-05-16 DIAGNOSIS — Z23 Encounter for immunization: Secondary | ICD-10-CM | POA: Diagnosis not present

## 2020-05-16 DIAGNOSIS — E039 Hypothyroidism, unspecified: Secondary | ICD-10-CM | POA: Diagnosis not present

## 2020-05-16 DIAGNOSIS — J309 Allergic rhinitis, unspecified: Secondary | ICD-10-CM | POA: Diagnosis not present

## 2020-05-16 DIAGNOSIS — R69 Illness, unspecified: Secondary | ICD-10-CM | POA: Diagnosis not present

## 2020-05-24 DIAGNOSIS — H524 Presbyopia: Secondary | ICD-10-CM | POA: Diagnosis not present

## 2020-05-30 DIAGNOSIS — H9313 Tinnitus, bilateral: Secondary | ICD-10-CM | POA: Insufficient documentation

## 2020-05-30 DIAGNOSIS — H903 Sensorineural hearing loss, bilateral: Secondary | ICD-10-CM | POA: Insufficient documentation

## 2020-06-25 ENCOUNTER — Ambulatory Visit
Admission: RE | Admit: 2020-06-25 | Discharge: 2020-06-25 | Disposition: A | Discharge status: Home / Self Care | Payer: No Typology Code available for payment source | Source: Ambulatory Visit | Attending: Internal Medicine | Admitting: Internal Medicine

## 2020-06-25 DIAGNOSIS — E785 Hyperlipidemia, unspecified: Secondary | ICD-10-CM | POA: Diagnosis not present

## 2020-06-25 DIAGNOSIS — E7800 Pure hypercholesterolemia, unspecified: Secondary | ICD-10-CM

## 2020-07-03 DIAGNOSIS — I2510 Atherosclerotic heart disease of native coronary artery without angina pectoris: Secondary | ICD-10-CM | POA: Diagnosis not present

## 2020-07-03 DIAGNOSIS — I700 Atherosclerosis of aorta: Secondary | ICD-10-CM | POA: Diagnosis not present

## 2020-07-03 DIAGNOSIS — R7303 Prediabetes: Secondary | ICD-10-CM | POA: Diagnosis not present

## 2020-07-03 DIAGNOSIS — I2584 Coronary atherosclerosis due to calcified coronary lesion: Secondary | ICD-10-CM | POA: Diagnosis not present

## 2020-08-02 DIAGNOSIS — H2511 Age-related nuclear cataract, right eye: Secondary | ICD-10-CM | POA: Diagnosis not present

## 2020-08-02 DIAGNOSIS — H25013 Cortical age-related cataract, bilateral: Secondary | ICD-10-CM | POA: Diagnosis not present

## 2020-08-02 DIAGNOSIS — H2513 Age-related nuclear cataract, bilateral: Secondary | ICD-10-CM | POA: Diagnosis not present

## 2020-08-02 DIAGNOSIS — H25043 Posterior subcapsular polar age-related cataract, bilateral: Secondary | ICD-10-CM | POA: Diagnosis not present

## 2020-08-02 DIAGNOSIS — H18413 Arcus senilis, bilateral: Secondary | ICD-10-CM | POA: Diagnosis not present

## 2020-08-10 DIAGNOSIS — R7303 Prediabetes: Secondary | ICD-10-CM | POA: Diagnosis not present

## 2020-08-10 DIAGNOSIS — I2510 Atherosclerotic heart disease of native coronary artery without angina pectoris: Secondary | ICD-10-CM | POA: Diagnosis not present

## 2020-08-10 DIAGNOSIS — I2584 Coronary atherosclerosis due to calcified coronary lesion: Secondary | ICD-10-CM | POA: Diagnosis not present

## 2020-08-17 DIAGNOSIS — I2510 Atherosclerotic heart disease of native coronary artery without angina pectoris: Secondary | ICD-10-CM | POA: Diagnosis not present

## 2020-08-22 IMAGING — MR MR SHOULDER*R* W/O CM
5 of 6 series · 28 of 40 positions shown · non-contrast
Comparison: None.

CLINICAL DATA: Shoulder pain.  Bursitis.  Pain for 8 months.

EXAM:
MRI OF THE RIGHT SHOULDER WITHOUT CONTRAST
TECHNIQUE: Multiplanar, multisequence MR imaging of the shoulder was performed.
No intravenous contrast was administered.

[Series 3: PD fat-sat · axial · 4.0mm · 0.27mm/px · z∈[-14,+75]mm · 7 of 20 slices shown (1 of 2)]
[im 1/20]
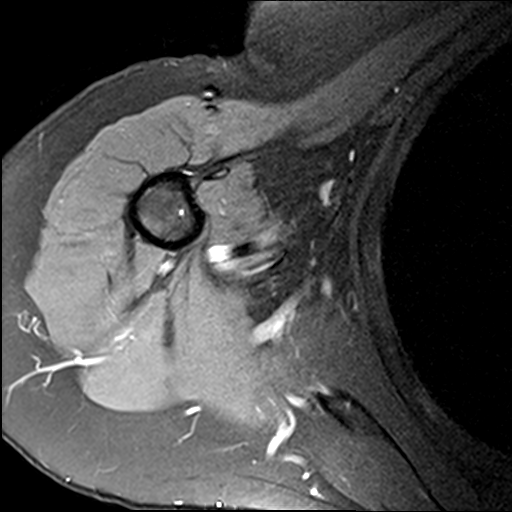
[im 4/20]
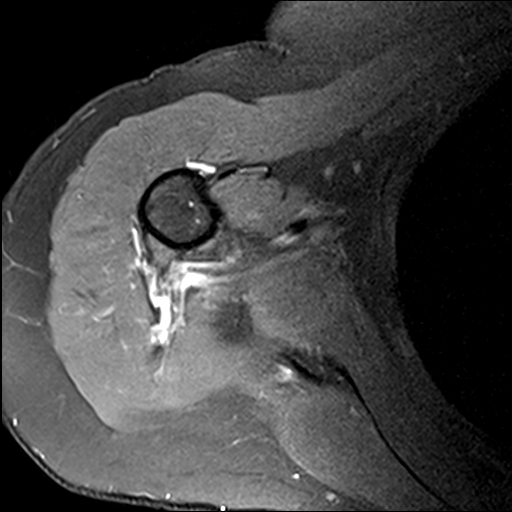
[im 7/20]
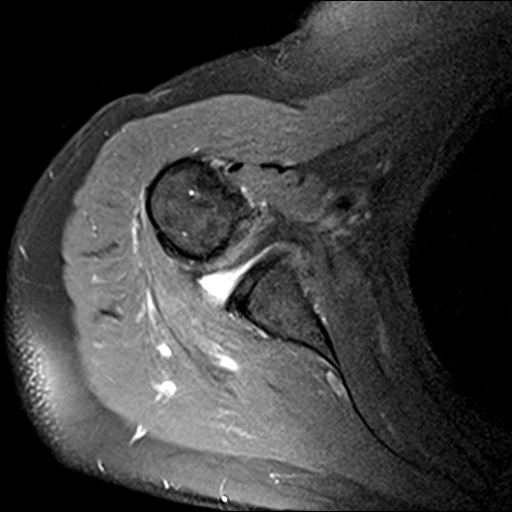
[im 10/20]
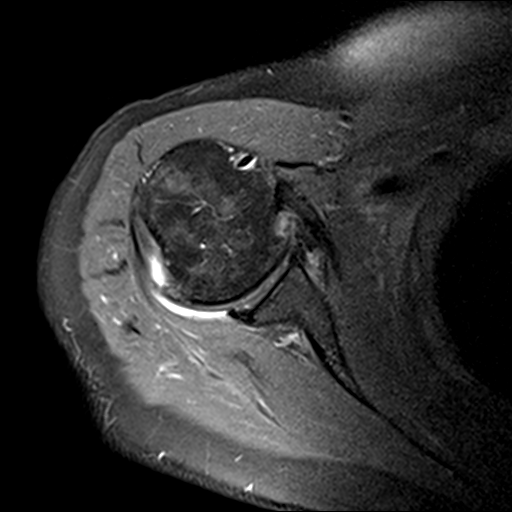
[im 13/20]
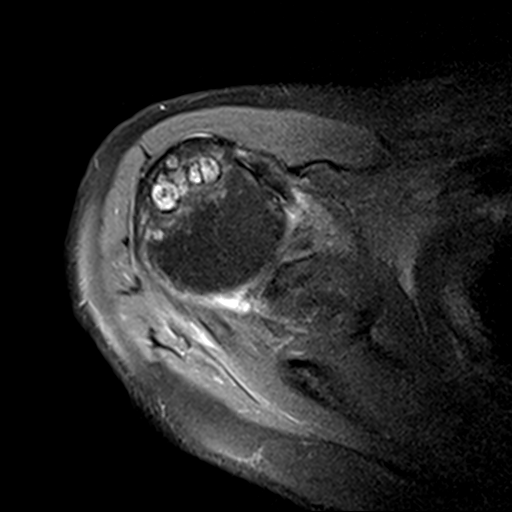
[im 16/20]
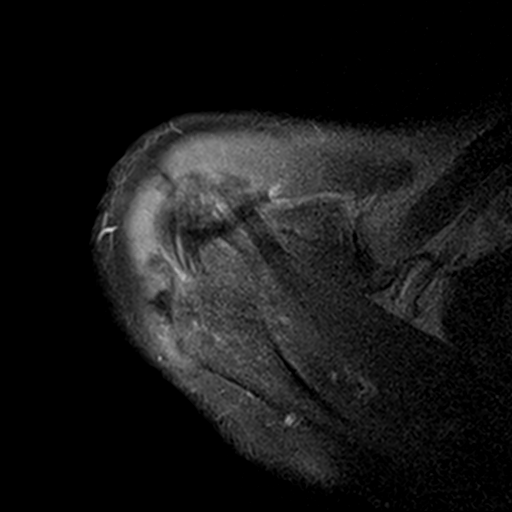
[im 20/20]
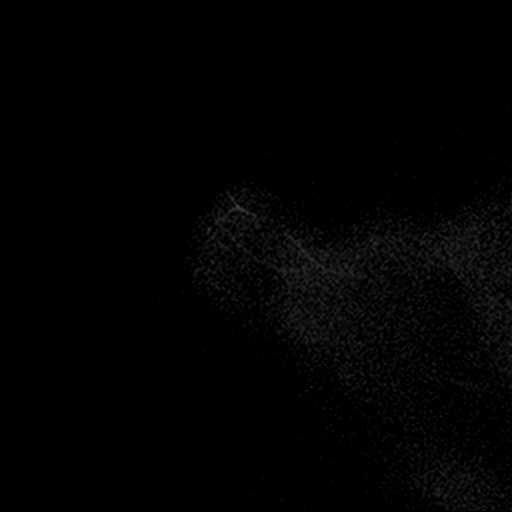

[Series 4: T2 fat-sat · oblique · 4.0mm · 0.55mm/px · 6 of 18 slices shown (1 of 2)]
[im 1/18]
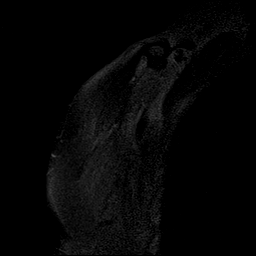
[im 4/18]
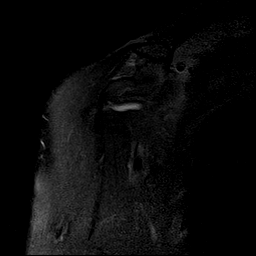
[im 7/18]
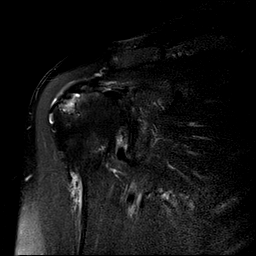
[im 11/18]
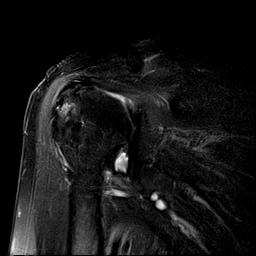
[im 14/18]
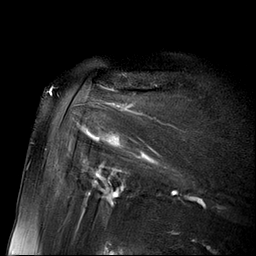
[im 18/18]
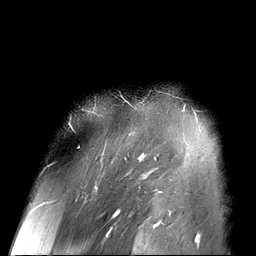

[Series 5: PD fat-sat · oblique · 4.0mm · 0.27mm/px · 6 of 18 slices shown (2 of 2)]
[im 1/18]
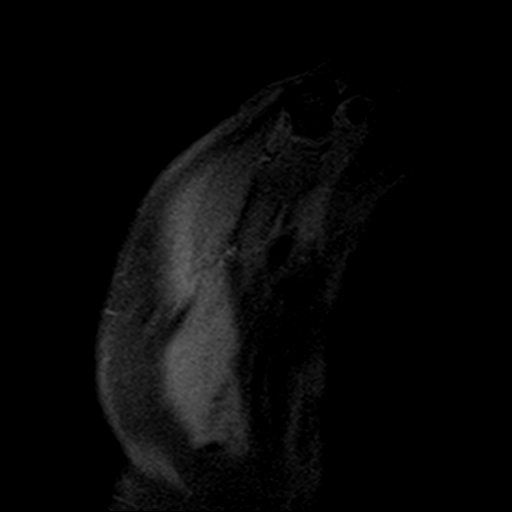
[im 4/18]
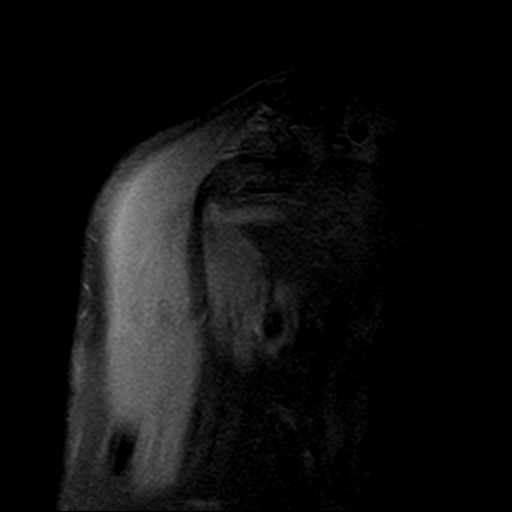
[im 7/18]
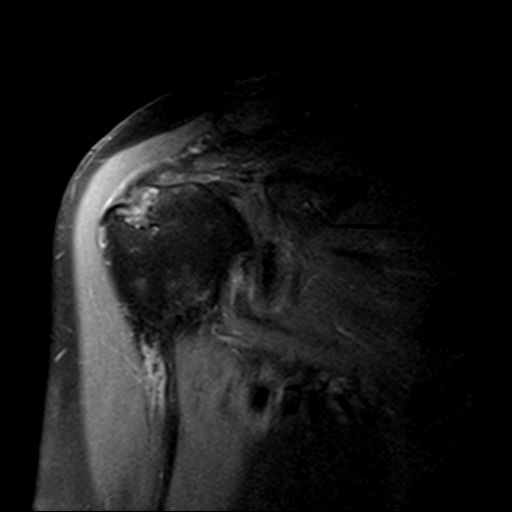
[im 11/18]
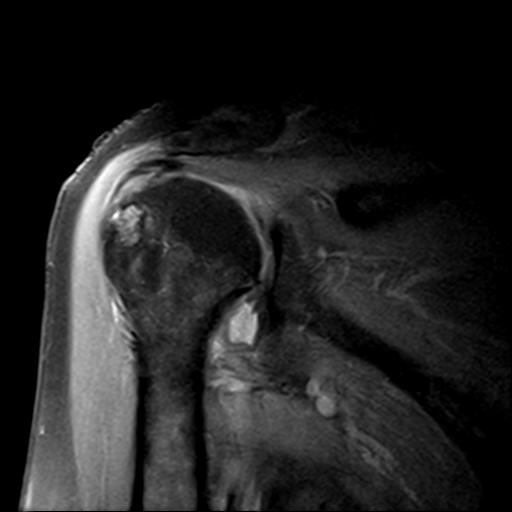
[im 14/18]
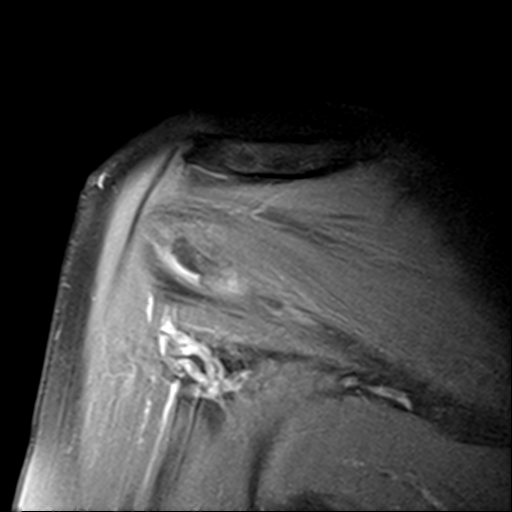
[im 18/18]
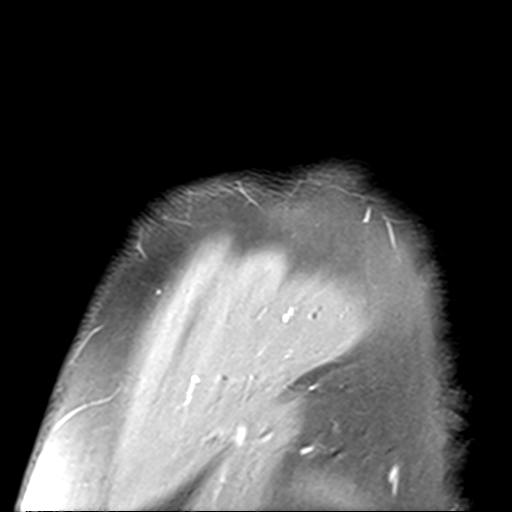

[Series 6: T2 fat-sat · oblique · 4.0mm · 0.55mm/px · 7 of 19 slices shown (2 of 2)]
[im 1/19]
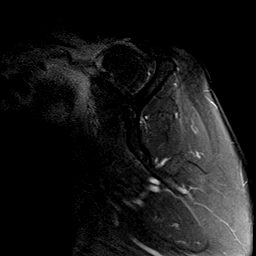
[im 4/19]
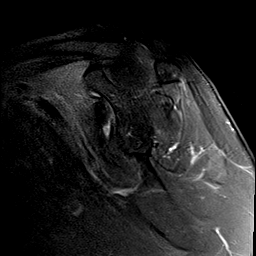
[im 7/19]
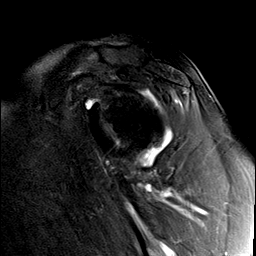
[im 10/19]
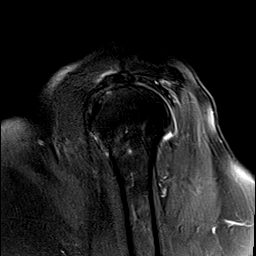
[im 13/19]
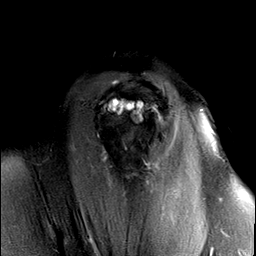
[im 16/19]
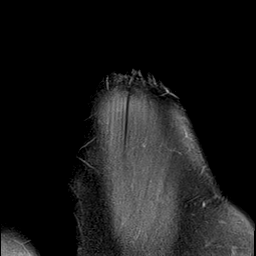
[im 19/19]
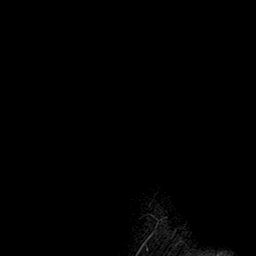

[Series 7: T1 · oblique · 4.0mm · 0.27mm/px · 2 of 19 slices shown]
[im 1/19]
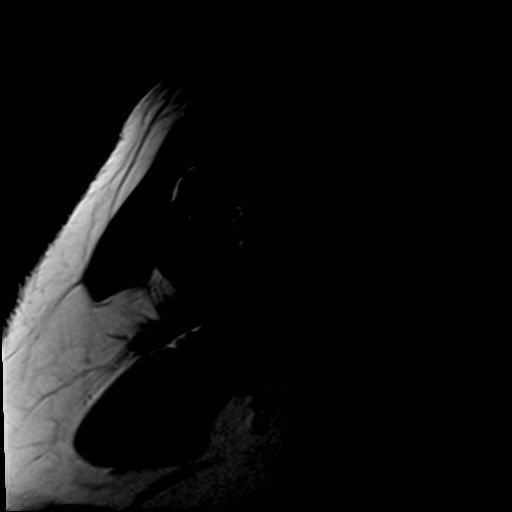
[im 4/19]
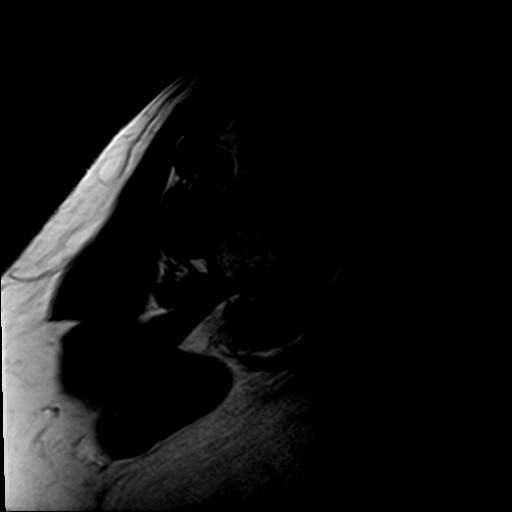

[28 of 40 positions shown; findings below may reference images not displayed]

FINDINGS: Rotator cuff: Moderate tendinosis of the supraspinatus and
infraspinatus tendons with subcortical reactive marrow changes.
Teres minor tendon is intact. Subscapularis tendon is intact.

Muscles: No muscle atrophy or edema. No intramuscular fluid
collection or hematoma.

Biceps Long Head: Mild tendinosis of the intra-articular portion of
the long head of the biceps tendon.

Acromioclavicular Joint: Moderate arthropathy of the
acromioclavicular joint. Type I acromion. Trace
subacromial/subdeltoid bursal fluid.

Glenohumeral Joint: Small joint effusion. High-grade
partial-thickness cartilage loss with areas of full-thickness
cartilage loss of the glenohumeral joint. Inferior humeral marginal
osteophyte.

Labrum: Superior labral degeneration with a posterosuperior labral
tear.

Bones: No fracture or dislocation. No aggressive osseous lesion.

Other: No fluid collection or hematoma.
IMPRESSION: 1. Moderate tendinosis of the supraspinatus and infraspinatus
tendons.
2. Mild tendinosis of the intra-articular portion of the long head
of the biceps tendon.
3. High-grade partial-thickness cartilage loss with areas of
full-thickness cartilage loss of the glenohumeral joint consistent
with osteoarthritis.
4. Superior labral degeneration with a posterosuperior labral tear.

## 2020-09-17 DIAGNOSIS — H25043 Posterior subcapsular polar age-related cataract, bilateral: Secondary | ICD-10-CM | POA: Diagnosis not present

## 2020-09-17 DIAGNOSIS — H18593 Other hereditary corneal dystrophies, bilateral: Secondary | ICD-10-CM | POA: Diagnosis not present

## 2020-09-17 DIAGNOSIS — H2513 Age-related nuclear cataract, bilateral: Secondary | ICD-10-CM | POA: Diagnosis not present

## 2020-09-17 DIAGNOSIS — H25013 Cortical age-related cataract, bilateral: Secondary | ICD-10-CM | POA: Diagnosis not present

## 2021-01-05 DIAGNOSIS — E785 Hyperlipidemia, unspecified: Secondary | ICD-10-CM | POA: Diagnosis not present

## 2021-01-05 DIAGNOSIS — M1990 Unspecified osteoarthritis, unspecified site: Secondary | ICD-10-CM | POA: Diagnosis not present

## 2021-01-05 DIAGNOSIS — Z8249 Family history of ischemic heart disease and other diseases of the circulatory system: Secondary | ICD-10-CM | POA: Diagnosis not present

## 2021-01-05 DIAGNOSIS — G4700 Insomnia, unspecified: Secondary | ICD-10-CM | POA: Diagnosis not present

## 2021-01-05 DIAGNOSIS — E039 Hypothyroidism, unspecified: Secondary | ICD-10-CM | POA: Diagnosis not present

## 2021-01-05 DIAGNOSIS — R69 Illness, unspecified: Secondary | ICD-10-CM | POA: Diagnosis not present

## 2021-01-05 DIAGNOSIS — I2510 Atherosclerotic heart disease of native coronary artery without angina pectoris: Secondary | ICD-10-CM | POA: Diagnosis not present

## 2021-01-05 DIAGNOSIS — G629 Polyneuropathy, unspecified: Secondary | ICD-10-CM | POA: Diagnosis not present

## 2021-01-05 DIAGNOSIS — Z7982 Long term (current) use of aspirin: Secondary | ICD-10-CM | POA: Diagnosis not present

## 2021-01-22 DIAGNOSIS — B349 Viral infection, unspecified: Secondary | ICD-10-CM | POA: Diagnosis not present

## 2021-01-22 DIAGNOSIS — J9801 Acute bronchospasm: Secondary | ICD-10-CM | POA: Diagnosis not present

## 2021-01-22 DIAGNOSIS — R0981 Nasal congestion: Secondary | ICD-10-CM | POA: Diagnosis not present

## 2021-01-22 DIAGNOSIS — Z20822 Contact with and (suspected) exposure to covid-19: Secondary | ICD-10-CM | POA: Diagnosis not present

## 2021-01-28 DIAGNOSIS — B9689 Other specified bacterial agents as the cause of diseases classified elsewhere: Secondary | ICD-10-CM | POA: Diagnosis not present

## 2021-01-28 DIAGNOSIS — J208 Acute bronchitis due to other specified organisms: Secondary | ICD-10-CM | POA: Diagnosis not present

## 2021-02-05 DIAGNOSIS — N952 Postmenopausal atrophic vaginitis: Secondary | ICD-10-CM | POA: Diagnosis not present

## 2021-02-05 DIAGNOSIS — Z1231 Encounter for screening mammogram for malignant neoplasm of breast: Secondary | ICD-10-CM | POA: Diagnosis not present

## 2021-02-05 DIAGNOSIS — Z124 Encounter for screening for malignant neoplasm of cervix: Secondary | ICD-10-CM | POA: Diagnosis not present

## 2021-02-05 DIAGNOSIS — Z6826 Body mass index (BMI) 26.0-26.9, adult: Secondary | ICD-10-CM | POA: Diagnosis not present

## 2021-02-05 DIAGNOSIS — Z779 Other contact with and (suspected) exposures hazardous to health: Secondary | ICD-10-CM | POA: Diagnosis not present

## 2021-02-05 DIAGNOSIS — Z853 Personal history of malignant neoplasm of breast: Secondary | ICD-10-CM | POA: Diagnosis not present

## 2021-02-05 DIAGNOSIS — E039 Hypothyroidism, unspecified: Secondary | ICD-10-CM | POA: Insufficient documentation

## 2021-02-13 DIAGNOSIS — H18592 Other hereditary corneal dystrophies, left eye: Secondary | ICD-10-CM | POA: Diagnosis not present

## 2021-02-21 DIAGNOSIS — R509 Fever, unspecified: Secondary | ICD-10-CM | POA: Diagnosis not present

## 2021-02-21 DIAGNOSIS — U071 COVID-19: Secondary | ICD-10-CM | POA: Diagnosis not present

## 2021-02-28 DIAGNOSIS — J0100 Acute maxillary sinusitis, unspecified: Secondary | ICD-10-CM | POA: Diagnosis not present

## 2021-02-28 DIAGNOSIS — R400 Somnolence: Secondary | ICD-10-CM | POA: Diagnosis not present

## 2021-02-28 DIAGNOSIS — G629 Polyneuropathy, unspecified: Secondary | ICD-10-CM | POA: Diagnosis not present

## 2021-02-28 DIAGNOSIS — R69 Illness, unspecified: Secondary | ICD-10-CM | POA: Diagnosis not present

## 2021-03-16 DIAGNOSIS — R059 Cough, unspecified: Secondary | ICD-10-CM | POA: Diagnosis not present

## 2021-03-16 DIAGNOSIS — Z20828 Contact with and (suspected) exposure to other viral communicable diseases: Secondary | ICD-10-CM | POA: Diagnosis not present

## 2021-03-16 DIAGNOSIS — R0981 Nasal congestion: Secondary | ICD-10-CM | POA: Diagnosis not present

## 2021-03-16 DIAGNOSIS — J040 Acute laryngitis: Secondary | ICD-10-CM | POA: Diagnosis not present

## 2021-04-02 ENCOUNTER — Encounter: Payer: Self-pay | Admitting: *Deleted

## 2021-04-03 ENCOUNTER — Ambulatory Visit: Payer: Medicare HMO | Admitting: Neurology

## 2021-04-03 ENCOUNTER — Encounter: Payer: Self-pay | Admitting: Neurology

## 2021-04-03 ENCOUNTER — Other Ambulatory Visit: Payer: Self-pay

## 2021-04-03 VITALS — BP 139/86 | HR 83 | Ht 62.0 in | Wt 147.0 lb

## 2021-04-03 DIAGNOSIS — Z820 Family history of epilepsy and other diseases of the nervous system: Secondary | ICD-10-CM

## 2021-04-03 DIAGNOSIS — R6889 Other general symptoms and signs: Secondary | ICD-10-CM

## 2021-04-03 DIAGNOSIS — E663 Overweight: Secondary | ICD-10-CM

## 2021-04-03 DIAGNOSIS — M26609 Unspecified temporomandibular joint disorder, unspecified side: Secondary | ICD-10-CM

## 2021-04-03 DIAGNOSIS — R0683 Snoring: Secondary | ICD-10-CM

## 2021-04-03 DIAGNOSIS — R351 Nocturia: Secondary | ICD-10-CM | POA: Diagnosis not present

## 2021-04-03 DIAGNOSIS — G4719 Other hypersomnia: Secondary | ICD-10-CM | POA: Diagnosis not present

## 2021-04-03 NOTE — Progress Notes (Signed)
Subjective:    Patient ID: Kayla Mueller is a 66 y.o. female.  HPI    Star Age, MD, PhD [facility] [address], [address] [place], [place] [address]  Dear Dr. Shelia Media,  I saw your patient, Kayla Mueller, upon your kind request in my sleep clinic today for initial consultation of her sleep disorder, in particular, concern for underlying obstructive sleep apnea.  The patient is unaccompanied today.  As you know, Kayla Mueller is a 66 year old right-handed woman with an underlying medical history of allergic rhinitis, hypothyroidism, arthritis with status post arthroscopic knee surgeries, breast cancer, and borderline overweight state, who reports snoring and excessive daytime somnolence.  She has noted intermittent forgetfulness. I reviewed your office note from 02/28/2021.  She was advised to change gabapentin to bedtime.  Her duloxetine was decreased to 30 mg from 60 mg.  She was advised to continue with bupropion.  She has been on gabapentin for symptoms of neuropathy.  She has been on trazodone at bedtime, 50 mg strength 1-2 nightly as needed.  She reports that she is no longer on Wellbutrin.  Epworth sleepiness score is 18 out of 24, fatigue severity score is 43 out of 63.  She is widowed, she lives alone, she has 2 grown children.  Her sister has sleep apnea and has a CPAP machine.  Her sister has encouraged her to get checked out for sleep apnea due to her snoring.  Reports a bedtime between 11 and midnight and rise time between 8 and 8:30 AM.  She has 2 dogs in the household, she endorses nocturia about once or twice per average night, she denies recurrent morning headaches.  She does not drink any caffeine on a daily basis, she is a non-smoker and drinks alcohol occasionally.  She has left-sided TMJ dysfunction.  Her husband passed away.  Her Past Medical History Is Significant For: Past Medical History:  Diagnosis Date   Allergic rhinitis     Breast cancer ([place])    right   Hypothyroid     Her Past Surgical History Is Significant For: Past Surgical History:  Procedure Laterality Date   bilateral arthroscopic knee surgery     BREAST LUMPECTOMY Right    about 12 years ago   CESAREAN SECTION      Her Family History Is Significant For: Family History  Problem Relation Age of Onset   Stroke Mother    Stroke Father    Sleep apnea Sister     Her Social History Is Significant For: Social History   Socioeconomic History   Marital status: Widowed    Spouse name: Not on file   Number of children: Not on file   Years of education: Not on file   Highest education level: Not on file  Occupational History   Not on file  Tobacco Use   Smoking status: Never   Smokeless tobacco: Never  Substance and Sexual Activity   Alcohol use: Never   Drug use: Not on file   Sexual activity: Not on file  Other Topics Concern   Not on file  Social History Narrative   Caffeine: 1-2 cups/day   Social Determinants of Health   Financial Resource Strain: Not on file  Food Insecurity: Not on file  Transportation Needs: Not on file  Physical Activity: Not on file  Stress: Not on file  Social Connections: Not on file    Her Allergies Are:  No Known Allergies:   Her Current  Medications Are:  Outpatient Encounter Medications as of 04/03/2021  Medication Sig   ASPIRIN 81 PO Take by mouth.   buPROPion (WELLBUTRIN XL) 150 MG 24 hr tablet Take 150 mg by mouth in the morning.   cefpodoxime (VANTIN) 200 MG tablet Take 1 tablet by mouth in the morning and at bedtime.   DULoxetine (CYMBALTA) 20 MG capsule Take 20 mg by mouth 2 (two) times daily.   fluticasone (FLONASE) 50 MCG/ACT nasal spray Place 2 sprays into both nostrils daily.   gabapentin (NEURONTIN) 300 MG capsule Take 1 capsule by mouth daily.   levothyroxine (SYNTHROID) 112 MCG tablet Take 112 mcg by mouth daily before breakfast.   rosuvastatin (CRESTOR) 20 MG tablet Take 20 mg by  mouth daily.   aspirin 81 MG EC tablet Take 81 mg by mouth daily.   DULoxetine (CYMBALTA) 30 MG capsule Take 1 capsule by mouth daily.   gabapentin (NEURONTIN) 100 MG capsule Take 100 mg by mouth 3 (three) times daily. (Patient not taking: Reported on 04/03/2021)   levothyroxine (SYNTHROID) 137 MCG tablet TAKE 1 TABLET ONCE DAILY INTHE MORNING ON AN EMPTY    STOMACH   traZODone (DESYREL) 50 MG tablet Take 1-2 tablets by mouth at bedtime as needed.   No facility-administered encounter medications on file as of 04/03/2021.  :   Review of Systems:  Out of a complete 14 point review of systems, all are reviewed and negative with the exception of these symptoms as listed below:  Review of Systems  Neurological:        Pt is here for sleep consult . Pt states she snores, and fatigue throughout day. Pt denies headaches,Hypertension ,sleep study, and CPAP at home   Ess:18 Fss:43    Objective:  Neurological Exam  Physical Exam Physical Examination:   Vitals:   04/03/21 1337  BP: 139/86  Pulse: 83    General Examination: The patient is a very pleasant 66 y.o. female in no acute distress. She appears well-developed and well-nourished and well groomed.   HEENT: Normocephalic, atraumatic, pupils are equal, round and reactive to light, extraocular tracking is good without limitation to gaze excursion or nystagmus noted. Hearing is grossly intact. Face is symmetric with normal facial animation. Speech is clear with no dysarthria noted. There is no hypophonia. There is no lip, neck/head, jaw or voice tremor. Neck is supple with full range of passive and active motion. There are no carotid bruits on auscultation. Oropharynx exam reveals: Mild mouth dryness, good dental hygiene, she has front veneers on the top 4 teeth.  She has mild airway crowding secondary to small airway entry and somewhat prominent uvula, Mallampati class II.  Tonsils absent.  Neck circumference of 13 and three-quarter inches.   Tongue protrudes centrally and palate elevates symmetrically.    Chest: Clear to auscultation without wheezing, rhonchi or crackles noted.  Heart: S1+S2+0, regular and normal without murmurs, rubs or gallops noted.   Abdomen: Soft, non-tender and non-distended.  Extremities: There is no pitting edema in the distal lower extremities bilaterally.   Skin: Warm and dry without trophic changes noted.   Musculoskeletal: exam reveals no obvious joint deformities.   Neurologically:  Mental status: The patient is awake, alert and oriented in all 4 spheres. Her immediate and remote memory, attention, language skills and fund of knowledge are appropriate. There is no evidence of aphasia, agnosia, apraxia or anomia. Speech is clear with normal prosody and enunciation. Thought process is linear. Mood is normal and affect  is normal.  Cranial nerves II - XII are as described above under HEENT exam.  Motor exam: Normal bulk, strength and tone is noted. There is no tremor. Fine motor skills and coordination: grossly intact.  Cerebellar testing: No dysmetria or intention tremor. There is no truncal or gait ataxia.  Sensory exam: intact to light touch in the upper and lower extremities.  Gait, station and balance: She stands easily. No veering to one side is noted. No leaning to one side is noted. Posture is age-appropriate and stance is narrow based. Gait shows normal stride length and normal pace. No problems turning are noted.  Assessment and plan:  In summary, Kayla Mueller is a very pleasant 66 y.o.-year old female with an underlying medical history of allergic rhinitis, hypothyroidism, arthritis with status post arthroscopic knee surgeries, breast cancer, and borderline overweight state, whose history and physical exam are concerning for obstructive sleep apnea (OSA). I had a long chat with the patient about my findings and the diagnosis of OSA, its prognosis and treatment options. We talked about  medical treatments, surgical interventions and non-pharmacological approaches. I explained in particular the risks and ramifications of untreated moderate to severe OSA, especially with respect to developing cardiovascular disease down the Road, including congestive heart failure, difficult to treat hypertension, cardiac arrhythmias, or stroke. Even type 2 diabetes has, in part, been linked to untreated OSA. Symptoms of untreated OSA include daytime sleepiness, memory problems, mood irritability and mood disorder such as depression and anxiety, lack of energy, as well as recurrent headaches, especially morning headaches. We talked about trying to maintain a healthy lifestyle in general, as well as the importance of weight control. We also talked about the importance of good sleep hygiene. I recommended the following at this time: sleep study.  I outlined the differences between a laboratory attended sleep study versus home sleep test.  She would prefer a home sleep test as she has 2 dogs at home that she cannot leave overnight. I explained the sleep test procedure to the patient and also outlined possible surgical and non-surgical treatment options of OSA, including the use of a custom-made dental device (which would require a referral to a specialist dentist or oral surgeon), upper airway surgical options, such as traditional UPPP or a novel less invasive surgical option in the form of Inspire hypoglossal nerve stimulation (which would involve a referral to an ENT surgeon). I also explained the CPAP treatment option to the patient, who indicated that she would be willing to try CPAP if the need arises. I explained the importance of being compliant with PAP treatment, not only for insurance purposes but primarily to improve Her symptoms, and for the patient's long term health benefit, including to reduce Her cardiovascular risks. I answered all her questions today and the patient was in agreement. I plan to see  her back after the sleep study is completed and encouraged her to call with any interim questions, concerns, problems or updates.   Thank you very much for allowing me to participate in the care of this nice patient. If I can be of any further assistance to you please do not hesitate to call me at [phone].  Sincerely,   Star Age, MD, PhD

## 2021-04-03 NOTE — Patient Instructions (Signed)

## 2021-04-18 NOTE — Progress Notes (Signed)
? ?I, Wendy Poet, LAT, ATC, am serving as scribe for Dr. [name]. ? ?Kayla Mueller is a 66 y.o. female who presents to [place] at [facility] today for f/u of chronic B shoulder pain, R>L.  She has been seen by Dr. Georgina Snell multiple times and recommended to see Orthopedics twice.  She was last seen by Dr. Georgina Snell on 04/03/20 and had B subacromial steroid injections. Today, pt reports bilat started hurting again a few months ago. Pt c/o increased pain when trying to pick items up. Pt had a visit w/ orthopedic surgery, but decided to not proceed w/ surgery due to life complications. ? ?Pt also c/o R foot pain ongoing for years. Pt locates pain to the dorsum of the R foot. Pt has this issue evaluated when in [place], and was told she had a lot of arthritis and would need surgery to fuse it. ? ?R foot swelling: no ?Aggravates: walking, wearing shoes ?Treatments tried: IBU, band-aid for cushion. ? ?Dx imaging: 09/08/19 R shoulder MRI ?            06/24/19 R & L shoulder XR ?  ?Pertinent review of systems: No fevers or chills ? ?Relevant historical information: History of breast cancer ? ? ?Exam:  ?BP (!) 142/80   Pulse 92   Ht '5\' 2"'$  (1.575 m)   Wt 150 lb 9.6 oz (68.3 kg)   SpO2 98%   BMI 27.55 kg/m?  ?General: Well Developed, well nourished, and in no acute distress.  ? ?MSK:  ?Right shoulder: Normal appearing ?Nontender. ?Range of motion abduction decreased at 130 degrees. ?Positive Hawkins and Neer's test. ?Strength 4+/5 abduction. ? ?Left shoulder normal appearing ?Nontender. ?Slight decreased range of motion. ?Positive impingement testing.  Intact strength. ? ?Right foot: Visible nodule dorsal midfoot.  Tender palpation. ?Antalgic gait. ? ? ? ?Lab and Radiology Results ? ?Procedure: Real-time Ultrasound Guided Injection of right shoulder subacromial bursa ?Device: Philips Affiniti 50G ?Images permanently stored and available for review in PACS ?Verbal informed consent obtained.  Discussed risks  and benefits of procedure. Warned about infection bleeding damage to structures skin hypopigmentation and fat atrophy among others. ?Patient expresses understanding and agreement ?Time-out conducted.   ?Noted no overlying erythema, induration, or other signs of local infection.   ?Skin prepped in a sterile fashion.   ?Local anesthesia: Topical Ethyl chloride.   ?With sterile technique and under real time ultrasound guidance: 40 mg of Kenalog and 2 mL of Marcaine injected into subacromial bursa. Fluid seen entering the bursa.   ?Completed without difficulty   ?Pain immediately resolved suggesting accurate placement of the medication.   ?Advised to call if fevers/chills, erythema, induration, drainage, or persistent bleeding.   ?Images permanently stored and available for review in the ultrasound unit.  ?Impression: Technically successful ultrasound guided injection. ? ? ? ?Procedure: Real-time Ultrasound Guided Injection of right foot dorsal midfoot second tarsometatarsal joint ?Device: Philips Affiniti 50G ?Images permanently stored and available for review in PACS ?Verbal informed consent obtained.  Discussed risks and benefits of procedure. Warned about infection bleeding damage to structures skin hypopigmentation and fat atrophy among others. ?Patient expresses understanding and agreement ?Time-out conducted.   ?Noted no overlying erythema, induration, or other signs of local infection.   ?Skin prepped in a sterile fashion.   ?Local anesthesia: Topical Ethyl chloride.   ?With sterile technique and under real time ultrasound guidance: 30 mg of Kenalog and 0.75 mL of Marcaine injected into dorsal midfoot tarsometatarsal joint. Fluid  seen entering the dorsal tarsal metatarsal joint.   ?Completed without difficulty   ?Pain immediately resolved suggesting accurate placement of the medication.   ?Advised to call if fevers/chills, erythema, induration, drainage, or persistent bleeding.   ?Images permanently stored and  available for review in the ultrasound unit.  ?Impression: Technically successful ultrasound guided injection. ? ? ?X-ray images right foot and right shoulder obtained today personally and independently interpreted ? 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  Moderate to significant AC DJD. ? ?Right foot: Question old injury to the Lisfranc joint with slight widening of the Lisfranc joint.  Degenerative changes in this area present especially around the second and third tarsometatarsal joints. ? ?Await formal radiology review ? ? ?EXAM: ?MRI OF THE RIGHT SHOULDER WITHOUT CONTRAST ?  ?TECHNIQUE: ?Multiplanar, multisequence MR imaging of the shoulder was performed. ?No intravenous contrast was administered. ?  ?COMPARISON:  None. ?  ?FINDINGS: ?Rotator cuff: Moderate tendinosis of the supraspinatus and ?infraspinatus tendons with subcortical reactive marrow changes. ?Teres minor tendon is intact. Subscapularis tendon is intact. ?  ?Muscles: No muscle atrophy or edema. No intramuscular fluid ?collection or hematoma. ?  ?Biceps Long Head: Mild tendinosis of the intra-articular portion of ?the long head of the biceps tendon. ?  ?Acromioclavicular Joint: Moderate arthropathy of the ?acromioclavicular joint. Type I acromion. Trace ?subacromial/subdeltoid bursal fluid. ?  ?Glenohumeral Joint: Small joint effusion. High-grade ?partial-thickness cartilage loss with areas of full-thickness ?cartilage loss of the glenohumeral joint. Inferior humeral marginal ?osteophyte. ?  ?Labrum: Superior labral degeneration with a posterosuperior labral ?tear. ?  ?Bones: No fracture or dislocation. No aggressive osseous lesion. ?  ?Other: No fluid collection or hematoma. ?  ?IMPRESSION: ?1. Moderate tendinosis of the supraspinatus and infraspinatus ?tendons. ?2. Mild tendinosis of the intra-articular portion of the long head ?of the biceps tendon. ?3. High-grade partial-thickness cartilage loss with areas of ?full-thickness  cartilage loss of the glenohumeral joint consistent ?with osteoarthritis. ?4. Superior labral degeneration with a posterosuperior labral tear. ?  ?  ?Electronically Signed ?  By: Kathreen Devoid ?  On: 09/09/2019 07:38 ?  ?I, [name], personally (independently) visualized and performed the interpretation of the images attached in this note. ? ? ? ?Assessment and Plan: ?66 y.o. female with  ?Chronic bilateral shoulder pain right worse than left.  Patient was geared up to have a shoulder surgery mid 2021 but delayed it is her sister was sick and she needed to help her sister. ?Plan for steroid injection subacromial bursa today.  Return in 1 month and consider glenohumeral joint injection as well. ? ?Additionally she has foot pain thought to be due to DJD.  Plan for midfoot injection and turf toe insole. ? ?Recheck 1 month ? ? ?PDMP not reviewed this encounter. ?Orders Placed This Encounter  ?Procedures  ? [place] LIMITED JOINT SPACE STRUCTURES UP BILAT(NO LINKED CHARGES)  ?  Order Specific Question:   Reason for Exam (SYMPTOM  OR DIAGNOSIS REQUIRED)  ?  Answer:   bilateral shoulder pain  ?  Order Specific Question:   Preferred imaging location?  ?  Answer:   [place]  ? DG Foot Complete Right  ?  Standing Status:   Future  ?  Number of Occurrences:   1  ?  Standing Expiration Date:   04/20/2022  ?  Order Specific Question:   Reason for Exam (SYMPTOM  OR DIAGNOSIS REQUIRED)  ?  Answer:   midfoot pain  ?  Order Specific Question:  Preferred imaging location?  ?  Answer:   [facility]  ? DG Shoulder Right  ?  Standing Status:   Future  ?  Number of Occurrences:   1  ?  Standing Expiration Date:   04/20/2022  ?  Order Specific Question:   Reason for Exam (SYMPTOM  OR DIAGNOSIS REQUIRED)  ?  Answer:   shoulder pain  ?  Order Specific Question:   Preferred imaging location?  ?  Answer:   [facility]  ? ?No orders of the defined types were placed in this encounter. ? ? ? ?Discussed  warning signs or symptoms. Please see discharge instructions. Patient expresses understanding. ? ? ?The above documentation has been reviewed and is accurate and complete [name], M.D. ? ? ?

## 2021-04-19 ENCOUNTER — Ambulatory Visit: Payer: Medicare HMO | Admitting: Family Medicine

## 2021-04-19 ENCOUNTER — Ambulatory Visit (INDEPENDENT_AMBULATORY_CARE_PROVIDER_SITE_OTHER): Payer: Medicare HMO

## 2021-04-19 ENCOUNTER — Ambulatory Visit: Payer: Self-pay

## 2021-04-19 ENCOUNTER — Other Ambulatory Visit: Payer: Self-pay

## 2021-04-19 VITALS — BP 142/80 | HR 92 | Ht 62.0 in | Wt 150.6 lb

## 2021-04-19 DIAGNOSIS — M25511 Pain in right shoulder: Secondary | ICD-10-CM

## 2021-04-19 DIAGNOSIS — I2510 Atherosclerotic heart disease of native coronary artery without angina pectoris: Secondary | ICD-10-CM | POA: Insufficient documentation

## 2021-04-19 DIAGNOSIS — Z853 Personal history of malignant neoplasm of breast: Secondary | ICD-10-CM | POA: Insufficient documentation

## 2021-04-19 DIAGNOSIS — M79671 Pain in right foot: Secondary | ICD-10-CM

## 2021-04-19 DIAGNOSIS — M25512 Pain in left shoulder: Secondary | ICD-10-CM | POA: Diagnosis not present

## 2021-04-19 DIAGNOSIS — G8929 Other chronic pain: Secondary | ICD-10-CM

## 2021-04-19 DIAGNOSIS — E7800 Pure hypercholesterolemia, unspecified: Secondary | ICD-10-CM | POA: Insufficient documentation

## 2021-04-19 DIAGNOSIS — M19071 Primary osteoarthritis, right ankle and foot: Secondary | ICD-10-CM | POA: Diagnosis not present

## 2021-04-19 DIAGNOSIS — Z833 Family history of diabetes mellitus: Secondary | ICD-10-CM | POA: Insufficient documentation

## 2021-04-19 DIAGNOSIS — I700 Atherosclerosis of aorta: Secondary | ICD-10-CM | POA: Insufficient documentation

## 2021-04-19 DIAGNOSIS — M25519 Pain in unspecified shoulder: Secondary | ICD-10-CM | POA: Insufficient documentation

## 2021-04-19 DIAGNOSIS — G629 Polyneuropathy, unspecified: Secondary | ICD-10-CM | POA: Insufficient documentation

## 2021-04-19 DIAGNOSIS — J309 Allergic rhinitis, unspecified: Secondary | ICD-10-CM | POA: Insufficient documentation

## 2021-04-19 DIAGNOSIS — R7303 Prediabetes: Secondary | ICD-10-CM | POA: Insufficient documentation

## 2021-04-19 DIAGNOSIS — F339 Major depressive disorder, recurrent, unspecified: Secondary | ICD-10-CM | POA: Insufficient documentation

## 2021-04-19 NOTE — Patient Instructions (Addendum)
Good to see you today. ? ?You had a R shoulder and R midfoot injection.  Call or go to the ER if you develop a large red swollen joint with extreme pain or oozing puss.  ? ?Please get an Xray today before you leave. ? ?Look into a steel turf toe insole.  You can find these on [employer]. ? ?Follow-up: one month ? ?

## 2021-04-22 NOTE — Progress Notes (Signed)
Right foot x-ray shows some arthritis at the big toe and in the midfoot.

## 2021-04-22 NOTE — Progress Notes (Signed)
Right shoulder x-ray shows medium arthritis in the main shoulder joint.

## 2021-04-29 DIAGNOSIS — G629 Polyneuropathy, unspecified: Secondary | ICD-10-CM | POA: Diagnosis not present

## 2021-04-29 DIAGNOSIS — D229 Melanocytic nevi, unspecified: Secondary | ICD-10-CM | POA: Diagnosis not present

## 2021-04-29 DIAGNOSIS — K2100 Gastro-esophageal reflux disease with esophagitis, without bleeding: Secondary | ICD-10-CM | POA: Diagnosis not present

## 2021-04-29 DIAGNOSIS — R69 Illness, unspecified: Secondary | ICD-10-CM | POA: Diagnosis not present

## 2021-04-29 DIAGNOSIS — R400 Somnolence: Secondary | ICD-10-CM | POA: Diagnosis not present

## 2021-05-13 ENCOUNTER — Ambulatory Visit (INDEPENDENT_AMBULATORY_CARE_PROVIDER_SITE_OTHER): Payer: Medicare HMO | Admitting: Neurology

## 2021-05-13 DIAGNOSIS — R6889 Other general symptoms and signs: Secondary | ICD-10-CM

## 2021-05-13 DIAGNOSIS — M26609 Unspecified temporomandibular joint disorder, unspecified side: Secondary | ICD-10-CM

## 2021-05-13 DIAGNOSIS — R0683 Snoring: Secondary | ICD-10-CM

## 2021-05-13 DIAGNOSIS — G4733 Obstructive sleep apnea (adult) (pediatric): Secondary | ICD-10-CM | POA: Diagnosis not present

## 2021-05-13 DIAGNOSIS — R351 Nocturia: Secondary | ICD-10-CM

## 2021-05-13 DIAGNOSIS — G4719 Other hypersomnia: Secondary | ICD-10-CM

## 2021-05-13 DIAGNOSIS — Z820 Family history of epilepsy and other diseases of the nervous system: Secondary | ICD-10-CM

## 2021-05-13 DIAGNOSIS — E663 Overweight: Secondary | ICD-10-CM

## 2021-05-15 NOTE — Progress Notes (Signed)
See procedure note.

## 2021-05-16 ENCOUNTER — Encounter: Payer: Self-pay | Admitting: *Deleted

## 2021-05-16 DIAGNOSIS — I2510 Atherosclerotic heart disease of native coronary artery without angina pectoris: Secondary | ICD-10-CM | POA: Diagnosis not present

## 2021-05-16 DIAGNOSIS — E039 Hypothyroidism, unspecified: Secondary | ICD-10-CM | POA: Diagnosis not present

## 2021-05-16 DIAGNOSIS — E7800 Pure hypercholesterolemia, unspecified: Secondary | ICD-10-CM | POA: Diagnosis not present

## 2021-05-16 DIAGNOSIS — R7303 Prediabetes: Secondary | ICD-10-CM | POA: Diagnosis not present

## 2021-05-16 DIAGNOSIS — I2584 Coronary atherosclerosis due to calcified coronary lesion: Secondary | ICD-10-CM | POA: Diagnosis not present

## 2021-05-16 NOTE — Procedures (Signed)
?  ? ?  [facility] ? ?HOME SLEEP TEST (Watch PAT) REPORT ? ?STUDY DATE: 05/13/2021 ? ?DOB: 1955-08-31 ? ?MRN: [id number] ? ?ORDERING CLINICIAN: Star Age, MD, PhD ?  ?REFERRING CLINICIAN: Deland Pretty, MD  ? ?CLINICAL INFORMATION/HISTORY: 66 year old right-handed woman with an underlying medical history of allergic rhinitis, hypothyroidism, arthritis with status post arthroscopic knee surgeries, breast cancer, and borderline overweight state, who reports snoring and excessive daytime somnolence. ? ?Epworth sleepiness score: 18/24. ? ?BMI: 27.2 kg/m? ? ?FINDINGS:  ? ?Sleep Summary:  ? ?Total Recording Time (hours, min): 9 hours, 46 minutes ? ?Total Sleep Time (hours, min):  7 hours, 46 minutes  ? ?Percent REM (%):    11.8%  ? ?Respiratory Indices:  ? ?Calculated pAHI (per hour):  19.5/hour        ? ?REM pAHI:    25.2/hour      ? ?NREM pAHI: 18.7/hour ? ?Oxygen Saturation Statistics:  ?  ?Oxygen Saturation (%) Mean: 92%  ? ?Minimum oxygen saturation (%):                 86%  ? ?O2 Saturation Range (%): 86-98%   ? ?O2 Saturation (minutes) <=88%: 2.8 min ? ?Pulse Rate Statistics:  ? ?Pulse Mean (bpm):    71/min   ? ?Pulse Range (58-97/min)  ? ?IMPRESSION: OSA (obstructive sleep apnea)  ? ?RECOMMENDATION:  ?This home sleep test demonstrates moderate obstructive sleep apnea with a total AHI of 19.5/hour and O2 nadir of 86%.  Mild to moderate snoring was detected.  Treatment with positive airway pressure is recommended. The patient will be advised to proceed with an autoPAP titration/trial at home for now. A full night titration study may be considered to optimize treatment settings, if needed down the road.  Alternative treatment options may include a dental device and selected candidates and the possibility of using a hypoglossal nerve stimulator in some patients.  Please note that untreated obstructive sleep apnea may carry additional perioperative morbidity. Patients with significant obstructive  sleep apnea should receive perioperative PAP therapy and the surgeons and particularly the anesthesiologist should be informed of the diagnosis and the severity of the sleep disordered breathing. ?The patient should be cautioned not to drive, work at heights, or operate dangerous or heavy equipment when tired or sleepy. Review and reiteration of good sleep hygiene measures should be pursued with any patient. ?Other causes of the patient's symptoms, including circadian rhythm disturbances, an underlying mood disorder, medication effect and/or an underlying medical problem cannot be ruled out based on this test. Clinical correlation is recommended. The patient and her referring provider will be notified of the test results. The patient will be seen in follow up in sleep clinic at [facility]. ? ?I certify that I have reviewed the raw data recording prior to the issuance of this report in accordance with the standards of the American Academy of Sleep Medicine (AASM). ? ?INTERPRETING PHYSICIAN:  ? ?Star Age, MD, PhD  ?Board Certified in Neurology and Sleep Medicine ? ?[facility] ?[address], [address] ?[place], [place] [address] ?[phone] ? ? ? ? ? ? ? ? ? ? ? ? ? ? ? ? ? ? ?

## 2021-05-16 NOTE — Addendum Note (Signed)
Addended by: Star Age on: 05/16/2021 05:51 PM ? ? Modules accepted: Orders ? ?

## 2021-05-20 ENCOUNTER — Encounter: Payer: Self-pay | Admitting: Neurology

## 2021-05-20 ENCOUNTER — Ambulatory Visit: Payer: Medicare HMO | Admitting: Neurology

## 2021-05-20 ENCOUNTER — Ambulatory Visit (INDEPENDENT_AMBULATORY_CARE_PROVIDER_SITE_OTHER): Payer: Medicare HMO | Admitting: Family Medicine

## 2021-05-20 VITALS — BP 125/75 | HR 73 | Ht 62.0 in | Wt 156.8 lb

## 2021-05-20 VITALS — BP 138/72 | HR 80 | Ht 62.0 in | Wt 156.8 lb

## 2021-05-20 DIAGNOSIS — M79671 Pain in right foot: Secondary | ICD-10-CM

## 2021-05-20 DIAGNOSIS — R202 Paresthesia of skin: Secondary | ICD-10-CM | POA: Diagnosis not present

## 2021-05-20 DIAGNOSIS — M79672 Pain in left foot: Secondary | ICD-10-CM | POA: Diagnosis not present

## 2021-05-20 DIAGNOSIS — R419 Unspecified symptoms and signs involving cognitive functions and awareness: Secondary | ICD-10-CM

## 2021-05-20 DIAGNOSIS — G4733 Obstructive sleep apnea (adult) (pediatric): Secondary | ICD-10-CM

## 2021-05-20 DIAGNOSIS — M25511 Pain in right shoulder: Secondary | ICD-10-CM

## 2021-05-20 DIAGNOSIS — R200 Anesthesia of skin: Secondary | ICD-10-CM

## 2021-05-20 NOTE — Patient Instructions (Signed)
It was nice to see you again.  ?As discussed, you may have a condition called peripheral neuropathy, i. e. nerve damage. Unfortunately, there is no specific treatment for most neuropathies. The most common cause for neuropathy is diabetes in this [place], in which case, tight glucose control is key.  Some studies suggest that obesity and pre-diabetes can also cause nerve damage even in the absence of a formal diagnosis of diabetes.   ? ?Other causes include thyroid disease, and some vitamin deficiencies. Certain medications such as chemotherapy agents and other chemicals or toxins including alcohol can cause neuropathy. There are some genetic conditions or hereditary neuropathies. Typically patients will report a family history of neuropathy in those conditions. There are cases associated with cancers and autoimmune conditions. Most neuropathies are progressive unless a root cause can be found and treated, which is rare, as I explained. For most neuropathies there is no actual cure or reversing of symptoms. Painful neuropathy can be difficult to treat symptomatically, but there are some medications available to ease the symptoms.  Thankfully, you have no significant pain symptoms at this time and can be monitored for symptoms.   ? ?Electrophysiologic testing with nerve conduction velocity studies and EMG (muscle testing, aka EMG/NCV) do not always pick up neuropathies that affect the smallest fibers.  ? ?We may consider an EMG/NCV test in the future.  ? ?For now, I recommend additional blood work:  ?I usually recommend you get checked the following labs:  ? ?TSH, vitamin B12, vitamin B6, vitamin D, protein electrophoresis, RF (rheumatoid factor), ANA, ESR.  ? ?Since you have had recent blood work with Dr. Shelia Media, you may have had some of these done already. You may add more blood work with him tomorrow.  ? ?We will set you up at home with a so called autoPAP machine for treatment of your overall moderate obstructive  sleep apnea.  ? ?

## 2021-05-20 NOTE — Patient Instructions (Signed)
Thank you for coming in today.  ? ?So glad you are feeling better! ? ?Recheck back as needed ?

## 2021-05-20 NOTE — Progress Notes (Signed)
Subjective:  ?  ?Patient ID: Kayla Mueller is a 66 y.o. female. ? ?HPI ? ? ? ?Interim history:  ? ?Kayla Mueller is a 66 year old right-handed woman with an underlying medical history of allergic rhinitis, hypothyroidism, arthritis with status post arthroscopic knee surgeries, breast cancer, and borderline overweight state, who presents for evaluation of her paresthesias, concern for neuropathy.  She is referred by her primary care physician.  She reports a 1+ year history of intermittent numbness and tingling affecting both feet as well as pain and burning sensation affecting both feet.  Gabapentin has been helpful, she takes 300 mg daily.  She had recent blood work and has a follow-up appointment to discuss this tomorrow.  She is not sure what all was checked but reports that she recently gained weight in the realm of 20 pounds and is worried that her prediabetes has become worse.  She tapered herself off of Cymbalta and Wellbutrin.  She is following with a counselor for her mood and it has been helpful but she does good tears during the appointment at 1 point.  She has arthritis, she has a family history of rheumatoid arthritis.  She had a recent steroid injection into the right forefoot.  She has had some numbness affecting primarily the right big toe, other symptoms are more intermittent.  ?I recently evaluated her for daytime somnolence, concern for sleep apnea.  She had a home sleep test on 05/13/2021, which indicated moderate obstructive sleep apnea with an AHI of 19.5/h, O2 nadir 86% with mild to moderate snoring detected.  She was advised to proceed with AutoPap therapy.  She is on gabapentin.  She is also on duloxetine and takes trazodone at night for sleep.  She had blood work through Dr. Denita Lung office and I was able to review results from 08/10/2020: A1c was 5.8, total cholesterol 136, triglycerides 57, LDL 73, hepatic panel showed total protein of 6.3, albumin 3.8, alk phos 90, ALT 25, AST 16. In  March 2022, her CBC with differential was benign, WBC was 6.5, RBC was 4.63, hemoglobin 13.8, hematocrit 42.1, CMP showed glucose of 100, BUN 15, creatinine 0.83. ? ?We talked about her sleep test results and she would be willing to pursue AutoPap therapy.  She also reports memory issues and a family history of dementia. ? ?Previously:  ? ?04/03/21: (She) reports snoring and excessive daytime somnolence.  She has noted intermittent forgetfulness. I reviewed your office note from 02/28/2021.  She was advised to change gabapentin to bedtime.  Her duloxetine was decreased to 30 mg from 60 mg.  She was advised to continue with bupropion.  She has been on gabapentin for symptoms of neuropathy.  She has been on trazodone at bedtime, 50 mg strength 1-2 nightly as needed.  She reports that she is no longer on Wellbutrin.  Epworth sleepiness score is 18 out of 24, fatigue severity score is 43 out of 63.  She is widowed, she lives alone, she has 2 grown children.  Her sister has sleep apnea and has a CPAP machine.  Her sister has encouraged her to get checked out for sleep apnea due to her snoring.  Reports a bedtime between 11 and midnight and rise time between 8 and 8:30 AM.  She has 2 dogs in the household, she endorses nocturia about once or twice per average night, she denies recurrent morning headaches.  She does not drink any caffeine on a daily basis, she is a non-smoker and drinks alcohol occasionally.  She has left-sided TMJ dysfunction.  Her husband passed away. ?The patient's allergies, current medications, family history, past medical history, past social history, past surgical history and problem list were reviewed and updated as appropriate.  ? ? ?Her Past Medical History Is Significant For: ?Past Medical History:  ?Diagnosis Date  ? Allergic rhinitis   ? Breast cancer ([place])   ? right  ? Hypothyroid   ? ? ?Her Past Surgical History Is Significant For: ?Past Surgical History:  ?Procedure Laterality Date  ?  bilateral arthroscopic knee surgery    ? BREAST LUMPECTOMY Right   ? about 12 years ago  ? CESAREAN SECTION    ? x2  ? ? ?Her Family History Is Significant For: ?Family History  ?Problem Relation Age of Onset  ? Stroke Mother   ? Stroke Father   ? Sleep apnea Sister   ? Neuropathy Half-Sibling   ? ? ?Her Social History Is Significant For: ?Social History  ? ?Socioeconomic History  ? Marital status: Widowed  ?  Spouse name: Not on file  ? Number of children: Not on file  ? Years of education: Not on file  ? Highest education level: Not on file  ?Occupational History  ? Not on file  ?Tobacco Use  ? Smoking status: Never  ? Smokeless tobacco: Never  ?Substance and Sexual Activity  ? Alcohol use: Yes  ?  Comment: social  ? Drug use: Not on file  ? Sexual activity: Not on file  ?Other Topics Concern  ? Not on file  ?Social History Narrative  ? Caffeine: 1-2 cups/day  ? ?Social Determinants of Health  ? ?Financial Resource Strain: Not on file  ?Food Insecurity: Not on file  ?Transportation Needs: Not on file  ?Physical Activity: Not on file  ?Stress: Not on file  ?Social Connections: Not on file  ? ? ?Her Allergies Are:  ?No Known Allergies:  ? ?Her Current Medications Are:  ?Outpatient Encounter Medications as of 05/20/2021  ?Medication Sig  ? aspirin 81 MG EC tablet Take 81 mg by mouth daily.  ? ASPIRIN 81 PO Take by mouth.  ? fluticasone (FLONASE) 50 MCG/ACT nasal spray Place 2 sprays into both nostrils daily.  ? gabapentin (NEURONTIN) 300 MG capsule Take 1 capsule by mouth daily.  ? levothyroxine (SYNTHROID) 137 MCG tablet TAKE 1 TABLET ONCE DAILY INTHE MORNING ON AN EMPTY    STOMACH  ? rosuvastatin (CRESTOR) 20 MG tablet Take 20 mg by mouth daily.  ? traZODone (DESYREL) 50 MG tablet Take 1-2 tablets by mouth as needed.  ? buPROPion (WELLBUTRIN XL) 150 MG 24 hr tablet Take 150 mg by mouth in the morning.  ? cefpodoxime (VANTIN) 200 MG tablet Take 1 tablet by mouth in the morning and at bedtime.  ? DULoxetine (CYMBALTA)  20 MG capsule Take 20 mg by mouth 2 (two) times daily.  ? DULoxetine (CYMBALTA) 30 MG capsule Take 1 capsule by mouth daily.  ? gabapentin (NEURONTIN) 100 MG capsule Take 100 mg by mouth 3 (three) times daily.  ? levothyroxine (SYNTHROID) 112 MCG tablet Take 112 mcg by mouth daily before breakfast.  ? ?No facility-administered encounter medications on file as of 05/20/2021.  ?: ? ?Review of Systems:  ?Out of a complete 14 point review of systems, all are reviewed and negative with the exception of these symptoms as listed below: ? ?Review of Systems  ?Neurological:   ?     Pt is here for neuropathy pain  Pt states  she has burning,tingling,and numbness in both feet and toes  Pt states she has Arthritis in both feet. Pt states she had a Cortizone injection  in right foot last month  ? ?Objective:  ?Neurological Exam ? ?Physical Exam ?Physical Examination:  ? ?Vitals:  ? 05/20/21 1449  ?BP: 125/75  ?Pulse: 73  ? ? ?General Examination: The patient is a very pleasant 65 y.o. female in no acute distress. She appears well-developed and well-nourished and well groomed.  ? ?HEENT: Normocephalic, atraumatic, pupils are equal, round and reactive to light, extraocular tracking is good without limitation to gaze excursion or nystagmus noted. Hearing is grossly intact. Face is symmetric with normal facial animation and normal facial sensation to light touch, temperature, pinprick and vibration. Speech is clear with no dysarthria noted. There is no hypophonia. There is no lip, neck/head, jaw or voice tremor. Neck is supple with full range of passive and active motion. There are no carotid bruits on auscultation. Oropharynx exam reveals: Mild mouth dryness, good dental hygiene, tongue protrudes centrally and palate elevates symmetrically.   ?  ?Chest: Clear to auscultation without wheezing, rhonchi or crackles noted. ?  ?Heart: S1+S2+0, regular and normal without murmurs, rubs or gallops noted.  ?  ?Abdomen: Soft, non-tender and  non-distended. ?  ?Extremities: There is no pitting edema in the distal lower extremities bilaterally.  ?  ?Skin: Warm and dry without trophic changes noted.  ?  ?Musculoskeletal: exam reveals no obvious joint d

## 2021-05-20 NOTE — Progress Notes (Signed)
? ?  I, Peterson Lombard, LAT, ATC acting as a scribe for [name], MD. ? ?Kayla Mueller is a 66 y.o. female who presents to [place] at [facility] today for f/u of chronic B shoulder pain, R>L, and R foot pain. Pt has been referred to orthopedic surgery twice for consultation on her R shoulder and was gearing up to have shoulder surgery mid 2021 but delayed it is her sister was sick and she needed to help her sister. Pt was last seen by Dr. Georgina Snell on 04/19/21 and was given a R subacromial and R 2nd 2nd tarsometatarsal steroid injections. Today, pt reports R foot and R shoulder are feeling 90% better.  ? ?Dx imaging:  04/19/21 R foot & R shoulder XR ?09/08/19 R shoulder MRI ?            06/24/19 R & L shoulder XR ? ?Pertinent review of systems: No fevers or chills ? ?Relevant historical information: Neuropathy ? ? ?Exam:  ?BP 138/72   Pulse 80   Ht '5\' 2"'$  (1.575 m)   Wt 156 lb 12.8 oz (71.1 kg)   SpO2 97%   BMI 28.68 kg/m?  ?General: Well Developed, well nourished, and in no acute distress.  ? ?MSK: Right shoulder normal-appearing normal motion. ? ?Right foot nontender ? ? ? ?Lab and Radiology Results ? ?EXAM: ?RIGHT SHOULDER - 2+ VIEW ?  ?COMPARISON:  Right shoulder radiographs 06/24/2019 ?  ?FINDINGS: ?Moderate glenohumeral joint space narrowing and peripheral ?osteophytosis. Moderate subcortical cystic change within the ?superolateral humeral head. Mild-to-moderate acromioclavicular joint ?space narrowing and peripheral osteophytosis degenerative change. ?  ?No acute fracture or dislocation. ?  ?IMPRESSION: ?No significant change from prior. 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 ?  ?Subcortical cystic changes within the superolateral humeral head, ?possibly from chronic subacromial impingement. ?  ?  ?Electronically Signed ?  By: Yvonne Kendall M.D. ?  On: 04/21/2021 19:02 ?  ?I, [name], personally (independently) visualized and performed the  interpretation of the images attached in this note. ? ? ? ? ?Assessment and Plan: ?66 y.o. female with shoulder pain multifactorial.  The majority the pain is currently thought to be due to rotator cuff tendinopathy and glenohumeral DJD.  She did have pretty good benefit from subacromial injection indicating that her pain is primarily tendinopathy related.  Hopefully she will have continued benefit from the injection.  I can repeat this injection about every 3 months.  Ultimately she probably will need a reverse total shoulder replacement. ? ?Foot pain: Due to DJD.  Steroid injection helped.  Can repeat this injection also every 3 months. ? ?Recheck back as needed. ? ? ?Discussed warning signs or symptoms. Please see discharge instructions. Patient expresses understanding. ? ? ?The above documentation has been reviewed and is accurate and complete [name], M.D. ? ? ?

## 2021-05-21 ENCOUNTER — Telehealth: Payer: Self-pay | Admitting: *Deleted

## 2021-05-21 DIAGNOSIS — R7303 Prediabetes: Secondary | ICD-10-CM | POA: Diagnosis not present

## 2021-05-21 DIAGNOSIS — Z0000 Encounter for general adult medical examination without abnormal findings: Secondary | ICD-10-CM | POA: Diagnosis not present

## 2021-05-21 DIAGNOSIS — E039 Hypothyroidism, unspecified: Secondary | ICD-10-CM | POA: Diagnosis not present

## 2021-05-21 DIAGNOSIS — K2100 Gastro-esophageal reflux disease with esophagitis, without bleeding: Secondary | ICD-10-CM | POA: Diagnosis not present

## 2021-05-21 DIAGNOSIS — I700 Atherosclerosis of aorta: Secondary | ICD-10-CM | POA: Diagnosis not present

## 2021-05-21 DIAGNOSIS — J309 Allergic rhinitis, unspecified: Secondary | ICD-10-CM | POA: Diagnosis not present

## 2021-05-21 DIAGNOSIS — G629 Polyneuropathy, unspecified: Secondary | ICD-10-CM | POA: Diagnosis not present

## 2021-05-21 DIAGNOSIS — I2510 Atherosclerotic heart disease of native coronary artery without angina pectoris: Secondary | ICD-10-CM | POA: Diagnosis not present

## 2021-05-21 NOTE — Telephone Encounter (Signed)
Spoke to pt.  She will proceed with [facility]. I relayed instructions of plan, once gets machine needs appt. For initial f/u pt verbalized understanding. Fax confirmation received [phone].  ?

## 2021-05-21 NOTE — Telephone Encounter (Signed)
I called pt and LMVM for her to return call for DME to send order for her cpap.  She lives in [place], [facility]) is option or if does not mind driving to [place], [facility] is another.  ?

## 2021-06-03 DIAGNOSIS — G4733 Obstructive sleep apnea (adult) (pediatric): Secondary | ICD-10-CM | POA: Diagnosis not present

## 2021-07-04 DIAGNOSIS — G4733 Obstructive sleep apnea (adult) (pediatric): Secondary | ICD-10-CM | POA: Diagnosis not present

## 2021-07-05 ENCOUNTER — Other Ambulatory Visit: Payer: Self-pay | Admitting: Internal Medicine

## 2021-07-05 DIAGNOSIS — Z1231 Encounter for screening mammogram for malignant neoplasm of breast: Secondary | ICD-10-CM

## 2021-07-11 ENCOUNTER — Ambulatory Visit: Payer: Medicare HMO

## 2021-07-16 ENCOUNTER — Ambulatory Visit: Payer: Medicare HMO | Admitting: Family Medicine

## 2021-07-16 DIAGNOSIS — M50321 Other cervical disc degeneration at C4-C5 level: Secondary | ICD-10-CM | POA: Diagnosis not present

## 2021-07-16 DIAGNOSIS — M5031 Other cervical disc degeneration,  high cervical region: Secondary | ICD-10-CM | POA: Diagnosis not present

## 2021-07-16 DIAGNOSIS — M50323 Other cervical disc degeneration at C6-C7 level: Secondary | ICD-10-CM | POA: Diagnosis not present

## 2021-07-16 DIAGNOSIS — M5413 Radiculopathy, cervicothoracic region: Secondary | ICD-10-CM | POA: Diagnosis not present

## 2021-07-16 DIAGNOSIS — M4312 Spondylolisthesis, cervical region: Secondary | ICD-10-CM | POA: Diagnosis not present

## 2021-07-16 DIAGNOSIS — M9901 Segmental and somatic dysfunction of cervical region: Secondary | ICD-10-CM | POA: Diagnosis not present

## 2021-07-16 DIAGNOSIS — M50322 Other cervical disc degeneration at C5-C6 level: Secondary | ICD-10-CM | POA: Diagnosis not present

## 2021-07-17 DIAGNOSIS — M50321 Other cervical disc degeneration at C4-C5 level: Secondary | ICD-10-CM | POA: Diagnosis not present

## 2021-07-17 DIAGNOSIS — M50323 Other cervical disc degeneration at C6-C7 level: Secondary | ICD-10-CM | POA: Diagnosis not present

## 2021-07-17 DIAGNOSIS — M5031 Other cervical disc degeneration,  high cervical region: Secondary | ICD-10-CM | POA: Diagnosis not present

## 2021-07-17 DIAGNOSIS — M5413 Radiculopathy, cervicothoracic region: Secondary | ICD-10-CM | POA: Diagnosis not present

## 2021-07-17 DIAGNOSIS — M9901 Segmental and somatic dysfunction of cervical region: Secondary | ICD-10-CM | POA: Diagnosis not present

## 2021-07-17 DIAGNOSIS — M50322 Other cervical disc degeneration at C5-C6 level: Secondary | ICD-10-CM | POA: Diagnosis not present

## 2021-07-17 DIAGNOSIS — M4312 Spondylolisthesis, cervical region: Secondary | ICD-10-CM | POA: Diagnosis not present

## 2021-07-18 DIAGNOSIS — M50321 Other cervical disc degeneration at C4-C5 level: Secondary | ICD-10-CM | POA: Diagnosis not present

## 2021-07-18 DIAGNOSIS — M5413 Radiculopathy, cervicothoracic region: Secondary | ICD-10-CM | POA: Diagnosis not present

## 2021-07-18 DIAGNOSIS — M5031 Other cervical disc degeneration,  high cervical region: Secondary | ICD-10-CM | POA: Diagnosis not present

## 2021-07-18 DIAGNOSIS — M9901 Segmental and somatic dysfunction of cervical region: Secondary | ICD-10-CM | POA: Diagnosis not present

## 2021-07-18 DIAGNOSIS — M4312 Spondylolisthesis, cervical region: Secondary | ICD-10-CM | POA: Diagnosis not present

## 2021-07-18 DIAGNOSIS — M50322 Other cervical disc degeneration at C5-C6 level: Secondary | ICD-10-CM | POA: Diagnosis not present

## 2021-07-18 DIAGNOSIS — M50323 Other cervical disc degeneration at C6-C7 level: Secondary | ICD-10-CM | POA: Diagnosis not present

## 2021-07-19 DIAGNOSIS — M5413 Radiculopathy, cervicothoracic region: Secondary | ICD-10-CM | POA: Diagnosis not present

## 2021-07-19 DIAGNOSIS — M5031 Other cervical disc degeneration,  high cervical region: Secondary | ICD-10-CM | POA: Diagnosis not present

## 2021-07-19 DIAGNOSIS — M9901 Segmental and somatic dysfunction of cervical region: Secondary | ICD-10-CM | POA: Diagnosis not present

## 2021-07-19 DIAGNOSIS — M4312 Spondylolisthesis, cervical region: Secondary | ICD-10-CM | POA: Diagnosis not present

## 2021-07-19 DIAGNOSIS — M50321 Other cervical disc degeneration at C4-C5 level: Secondary | ICD-10-CM | POA: Diagnosis not present

## 2021-07-19 DIAGNOSIS — M50322 Other cervical disc degeneration at C5-C6 level: Secondary | ICD-10-CM | POA: Diagnosis not present

## 2021-07-19 DIAGNOSIS — M50323 Other cervical disc degeneration at C6-C7 level: Secondary | ICD-10-CM | POA: Diagnosis not present

## 2021-07-22 DIAGNOSIS — E039 Hypothyroidism, unspecified: Secondary | ICD-10-CM | POA: Diagnosis not present

## 2021-07-23 DIAGNOSIS — M50321 Other cervical disc degeneration at C4-C5 level: Secondary | ICD-10-CM | POA: Diagnosis not present

## 2021-07-23 DIAGNOSIS — M50323 Other cervical disc degeneration at C6-C7 level: Secondary | ICD-10-CM | POA: Diagnosis not present

## 2021-07-23 DIAGNOSIS — M5413 Radiculopathy, cervicothoracic region: Secondary | ICD-10-CM | POA: Diagnosis not present

## 2021-07-23 DIAGNOSIS — M50322 Other cervical disc degeneration at C5-C6 level: Secondary | ICD-10-CM | POA: Diagnosis not present

## 2021-07-23 DIAGNOSIS — M9901 Segmental and somatic dysfunction of cervical region: Secondary | ICD-10-CM | POA: Diagnosis not present

## 2021-07-23 DIAGNOSIS — M4312 Spondylolisthesis, cervical region: Secondary | ICD-10-CM | POA: Diagnosis not present

## 2021-07-23 DIAGNOSIS — M5031 Other cervical disc degeneration,  high cervical region: Secondary | ICD-10-CM | POA: Diagnosis not present

## 2021-07-24 DIAGNOSIS — M50322 Other cervical disc degeneration at C5-C6 level: Secondary | ICD-10-CM | POA: Diagnosis not present

## 2021-07-24 DIAGNOSIS — M50323 Other cervical disc degeneration at C6-C7 level: Secondary | ICD-10-CM | POA: Diagnosis not present

## 2021-07-24 DIAGNOSIS — M50321 Other cervical disc degeneration at C4-C5 level: Secondary | ICD-10-CM | POA: Diagnosis not present

## 2021-07-24 DIAGNOSIS — M4312 Spondylolisthesis, cervical region: Secondary | ICD-10-CM | POA: Diagnosis not present

## 2021-07-24 DIAGNOSIS — M5031 Other cervical disc degeneration,  high cervical region: Secondary | ICD-10-CM | POA: Diagnosis not present

## 2021-07-24 DIAGNOSIS — M9901 Segmental and somatic dysfunction of cervical region: Secondary | ICD-10-CM | POA: Diagnosis not present

## 2021-07-24 DIAGNOSIS — M5413 Radiculopathy, cervicothoracic region: Secondary | ICD-10-CM | POA: Diagnosis not present

## 2021-07-26 DIAGNOSIS — M50323 Other cervical disc degeneration at C6-C7 level: Secondary | ICD-10-CM | POA: Diagnosis not present

## 2021-07-26 DIAGNOSIS — M5031 Other cervical disc degeneration,  high cervical region: Secondary | ICD-10-CM | POA: Diagnosis not present

## 2021-07-26 DIAGNOSIS — M9901 Segmental and somatic dysfunction of cervical region: Secondary | ICD-10-CM | POA: Diagnosis not present

## 2021-07-26 DIAGNOSIS — M50321 Other cervical disc degeneration at C4-C5 level: Secondary | ICD-10-CM | POA: Diagnosis not present

## 2021-07-26 DIAGNOSIS — M4312 Spondylolisthesis, cervical region: Secondary | ICD-10-CM | POA: Diagnosis not present

## 2021-07-26 DIAGNOSIS — M50322 Other cervical disc degeneration at C5-C6 level: Secondary | ICD-10-CM | POA: Diagnosis not present

## 2021-07-26 DIAGNOSIS — M5413 Radiculopathy, cervicothoracic region: Secondary | ICD-10-CM | POA: Diagnosis not present

## 2021-07-29 DIAGNOSIS — M5413 Radiculopathy, cervicothoracic region: Secondary | ICD-10-CM | POA: Diagnosis not present

## 2021-07-29 DIAGNOSIS — M50323 Other cervical disc degeneration at C6-C7 level: Secondary | ICD-10-CM | POA: Diagnosis not present

## 2021-07-29 DIAGNOSIS — M9901 Segmental and somatic dysfunction of cervical region: Secondary | ICD-10-CM | POA: Diagnosis not present

## 2021-07-29 DIAGNOSIS — M50321 Other cervical disc degeneration at C4-C5 level: Secondary | ICD-10-CM | POA: Diagnosis not present

## 2021-07-29 DIAGNOSIS — M50322 Other cervical disc degeneration at C5-C6 level: Secondary | ICD-10-CM | POA: Diagnosis not present

## 2021-07-29 DIAGNOSIS — M5031 Other cervical disc degeneration,  high cervical region: Secondary | ICD-10-CM | POA: Diagnosis not present

## 2021-07-29 DIAGNOSIS — M4312 Spondylolisthesis, cervical region: Secondary | ICD-10-CM | POA: Diagnosis not present

## 2021-07-30 DIAGNOSIS — M9901 Segmental and somatic dysfunction of cervical region: Secondary | ICD-10-CM | POA: Diagnosis not present

## 2021-07-30 DIAGNOSIS — M50322 Other cervical disc degeneration at C5-C6 level: Secondary | ICD-10-CM | POA: Diagnosis not present

## 2021-07-30 DIAGNOSIS — M5031 Other cervical disc degeneration,  high cervical region: Secondary | ICD-10-CM | POA: Diagnosis not present

## 2021-07-30 DIAGNOSIS — M9905 Segmental and somatic dysfunction of pelvic region: Secondary | ICD-10-CM | POA: Diagnosis not present

## 2021-07-30 DIAGNOSIS — M4312 Spondylolisthesis, cervical region: Secondary | ICD-10-CM | POA: Diagnosis not present

## 2021-07-30 DIAGNOSIS — M5413 Radiculopathy, cervicothoracic region: Secondary | ICD-10-CM | POA: Diagnosis not present

## 2021-07-30 DIAGNOSIS — M5135 Other intervertebral disc degeneration, thoracolumbar region: Secondary | ICD-10-CM | POA: Diagnosis not present

## 2021-07-30 DIAGNOSIS — M50323 Other cervical disc degeneration at C6-C7 level: Secondary | ICD-10-CM | POA: Diagnosis not present

## 2021-07-30 DIAGNOSIS — M9903 Segmental and somatic dysfunction of lumbar region: Secondary | ICD-10-CM | POA: Diagnosis not present

## 2021-07-30 DIAGNOSIS — M50321 Other cervical disc degeneration at C4-C5 level: Secondary | ICD-10-CM | POA: Diagnosis not present

## 2021-07-30 DIAGNOSIS — M9904 Segmental and somatic dysfunction of sacral region: Secondary | ICD-10-CM | POA: Diagnosis not present

## 2021-07-31 DIAGNOSIS — M9903 Segmental and somatic dysfunction of lumbar region: Secondary | ICD-10-CM | POA: Diagnosis not present

## 2021-07-31 DIAGNOSIS — M50321 Other cervical disc degeneration at C4-C5 level: Secondary | ICD-10-CM | POA: Diagnosis not present

## 2021-07-31 DIAGNOSIS — M5135 Other intervertebral disc degeneration, thoracolumbar region: Secondary | ICD-10-CM | POA: Diagnosis not present

## 2021-07-31 DIAGNOSIS — L720 Epidermal cyst: Secondary | ICD-10-CM | POA: Diagnosis not present

## 2021-07-31 DIAGNOSIS — M50323 Other cervical disc degeneration at C6-C7 level: Secondary | ICD-10-CM | POA: Diagnosis not present

## 2021-07-31 DIAGNOSIS — L820 Inflamed seborrheic keratosis: Secondary | ICD-10-CM | POA: Diagnosis not present

## 2021-07-31 DIAGNOSIS — M4312 Spondylolisthesis, cervical region: Secondary | ICD-10-CM | POA: Diagnosis not present

## 2021-07-31 DIAGNOSIS — M9901 Segmental and somatic dysfunction of cervical region: Secondary | ICD-10-CM | POA: Diagnosis not present

## 2021-07-31 DIAGNOSIS — L821 Other seborrheic keratosis: Secondary | ICD-10-CM | POA: Diagnosis not present

## 2021-07-31 DIAGNOSIS — M9905 Segmental and somatic dysfunction of pelvic region: Secondary | ICD-10-CM | POA: Diagnosis not present

## 2021-07-31 DIAGNOSIS — I781 Nevus, non-neoplastic: Secondary | ICD-10-CM | POA: Diagnosis not present

## 2021-07-31 DIAGNOSIS — M9904 Segmental and somatic dysfunction of sacral region: Secondary | ICD-10-CM | POA: Diagnosis not present

## 2021-07-31 DIAGNOSIS — M5031 Other cervical disc degeneration,  high cervical region: Secondary | ICD-10-CM | POA: Diagnosis not present

## 2021-07-31 DIAGNOSIS — L814 Other melanin hyperpigmentation: Secondary | ICD-10-CM | POA: Diagnosis not present

## 2021-07-31 DIAGNOSIS — M5413 Radiculopathy, cervicothoracic region: Secondary | ICD-10-CM | POA: Diagnosis not present

## 2021-07-31 DIAGNOSIS — X32XXXS Exposure to sunlight, sequela: Secondary | ICD-10-CM | POA: Diagnosis not present

## 2021-07-31 DIAGNOSIS — M50322 Other cervical disc degeneration at C5-C6 level: Secondary | ICD-10-CM | POA: Diagnosis not present

## 2021-07-31 DIAGNOSIS — D485 Neoplasm of uncertain behavior of skin: Secondary | ICD-10-CM | POA: Diagnosis not present

## 2021-08-02 DIAGNOSIS — M5031 Other cervical disc degeneration,  high cervical region: Secondary | ICD-10-CM | POA: Diagnosis not present

## 2021-08-02 DIAGNOSIS — M50321 Other cervical disc degeneration at C4-C5 level: Secondary | ICD-10-CM | POA: Diagnosis not present

## 2021-08-02 DIAGNOSIS — M50322 Other cervical disc degeneration at C5-C6 level: Secondary | ICD-10-CM | POA: Diagnosis not present

## 2021-08-02 DIAGNOSIS — M9901 Segmental and somatic dysfunction of cervical region: Secondary | ICD-10-CM | POA: Diagnosis not present

## 2021-08-02 DIAGNOSIS — M5135 Other intervertebral disc degeneration, thoracolumbar region: Secondary | ICD-10-CM | POA: Diagnosis not present

## 2021-08-02 DIAGNOSIS — M50323 Other cervical disc degeneration at C6-C7 level: Secondary | ICD-10-CM | POA: Diagnosis not present

## 2021-08-02 DIAGNOSIS — M9904 Segmental and somatic dysfunction of sacral region: Secondary | ICD-10-CM | POA: Diagnosis not present

## 2021-08-02 DIAGNOSIS — M9903 Segmental and somatic dysfunction of lumbar region: Secondary | ICD-10-CM | POA: Diagnosis not present

## 2021-08-02 DIAGNOSIS — M4312 Spondylolisthesis, cervical region: Secondary | ICD-10-CM | POA: Diagnosis not present

## 2021-08-02 DIAGNOSIS — M5413 Radiculopathy, cervicothoracic region: Secondary | ICD-10-CM | POA: Diagnosis not present

## 2021-08-02 DIAGNOSIS — M9905 Segmental and somatic dysfunction of pelvic region: Secondary | ICD-10-CM | POA: Diagnosis not present

## 2021-08-03 DIAGNOSIS — G4733 Obstructive sleep apnea (adult) (pediatric): Secondary | ICD-10-CM | POA: Diagnosis not present

## 2021-08-07 DIAGNOSIS — M50323 Other cervical disc degeneration at C6-C7 level: Secondary | ICD-10-CM | POA: Diagnosis not present

## 2021-08-07 DIAGNOSIS — M9904 Segmental and somatic dysfunction of sacral region: Secondary | ICD-10-CM | POA: Diagnosis not present

## 2021-08-07 DIAGNOSIS — M5031 Other cervical disc degeneration,  high cervical region: Secondary | ICD-10-CM | POA: Diagnosis not present

## 2021-08-07 DIAGNOSIS — M50322 Other cervical disc degeneration at C5-C6 level: Secondary | ICD-10-CM | POA: Diagnosis not present

## 2021-08-07 DIAGNOSIS — M9901 Segmental and somatic dysfunction of cervical region: Secondary | ICD-10-CM | POA: Diagnosis not present

## 2021-08-07 DIAGNOSIS — M4312 Spondylolisthesis, cervical region: Secondary | ICD-10-CM | POA: Diagnosis not present

## 2021-08-07 DIAGNOSIS — M9905 Segmental and somatic dysfunction of pelvic region: Secondary | ICD-10-CM | POA: Diagnosis not present

## 2021-08-07 DIAGNOSIS — M5413 Radiculopathy, cervicothoracic region: Secondary | ICD-10-CM | POA: Diagnosis not present

## 2021-08-07 DIAGNOSIS — M9903 Segmental and somatic dysfunction of lumbar region: Secondary | ICD-10-CM | POA: Diagnosis not present

## 2021-08-07 DIAGNOSIS — M50321 Other cervical disc degeneration at C4-C5 level: Secondary | ICD-10-CM | POA: Diagnosis not present

## 2021-08-07 DIAGNOSIS — M5135 Other intervertebral disc degeneration, thoracolumbar region: Secondary | ICD-10-CM | POA: Diagnosis not present

## 2021-08-08 DIAGNOSIS — M9901 Segmental and somatic dysfunction of cervical region: Secondary | ICD-10-CM | POA: Diagnosis not present

## 2021-08-08 DIAGNOSIS — M5135 Other intervertebral disc degeneration, thoracolumbar region: Secondary | ICD-10-CM | POA: Diagnosis not present

## 2021-08-08 DIAGNOSIS — M9904 Segmental and somatic dysfunction of sacral region: Secondary | ICD-10-CM | POA: Diagnosis not present

## 2021-08-08 DIAGNOSIS — M5031 Other cervical disc degeneration,  high cervical region: Secondary | ICD-10-CM | POA: Diagnosis not present

## 2021-08-08 DIAGNOSIS — M9903 Segmental and somatic dysfunction of lumbar region: Secondary | ICD-10-CM | POA: Diagnosis not present

## 2021-08-08 DIAGNOSIS — M5413 Radiculopathy, cervicothoracic region: Secondary | ICD-10-CM | POA: Diagnosis not present

## 2021-08-08 DIAGNOSIS — M50323 Other cervical disc degeneration at C6-C7 level: Secondary | ICD-10-CM | POA: Diagnosis not present

## 2021-08-08 DIAGNOSIS — M4312 Spondylolisthesis, cervical region: Secondary | ICD-10-CM | POA: Diagnosis not present

## 2021-08-08 DIAGNOSIS — M50322 Other cervical disc degeneration at C5-C6 level: Secondary | ICD-10-CM | POA: Diagnosis not present

## 2021-08-08 DIAGNOSIS — M50321 Other cervical disc degeneration at C4-C5 level: Secondary | ICD-10-CM | POA: Diagnosis not present

## 2021-08-08 DIAGNOSIS — M9905 Segmental and somatic dysfunction of pelvic region: Secondary | ICD-10-CM | POA: Diagnosis not present

## 2021-08-09 DIAGNOSIS — M9901 Segmental and somatic dysfunction of cervical region: Secondary | ICD-10-CM | POA: Diagnosis not present

## 2021-08-09 DIAGNOSIS — M9904 Segmental and somatic dysfunction of sacral region: Secondary | ICD-10-CM | POA: Diagnosis not present

## 2021-08-09 DIAGNOSIS — M5413 Radiculopathy, cervicothoracic region: Secondary | ICD-10-CM | POA: Diagnosis not present

## 2021-08-09 DIAGNOSIS — M9903 Segmental and somatic dysfunction of lumbar region: Secondary | ICD-10-CM | POA: Diagnosis not present

## 2021-08-09 DIAGNOSIS — M50321 Other cervical disc degeneration at C4-C5 level: Secondary | ICD-10-CM | POA: Diagnosis not present

## 2021-08-09 DIAGNOSIS — M9905 Segmental and somatic dysfunction of pelvic region: Secondary | ICD-10-CM | POA: Diagnosis not present

## 2021-08-09 DIAGNOSIS — M50323 Other cervical disc degeneration at C6-C7 level: Secondary | ICD-10-CM | POA: Diagnosis not present

## 2021-08-09 DIAGNOSIS — M50322 Other cervical disc degeneration at C5-C6 level: Secondary | ICD-10-CM | POA: Diagnosis not present

## 2021-08-09 DIAGNOSIS — M5031 Other cervical disc degeneration,  high cervical region: Secondary | ICD-10-CM | POA: Diagnosis not present

## 2021-08-09 DIAGNOSIS — M4312 Spondylolisthesis, cervical region: Secondary | ICD-10-CM | POA: Diagnosis not present

## 2021-08-09 DIAGNOSIS — M5135 Other intervertebral disc degeneration, thoracolumbar region: Secondary | ICD-10-CM | POA: Diagnosis not present

## 2021-08-12 DIAGNOSIS — M4312 Spondylolisthesis, cervical region: Secondary | ICD-10-CM | POA: Diagnosis not present

## 2021-08-12 DIAGNOSIS — M9901 Segmental and somatic dysfunction of cervical region: Secondary | ICD-10-CM | POA: Diagnosis not present

## 2021-08-12 DIAGNOSIS — M5135 Other intervertebral disc degeneration, thoracolumbar region: Secondary | ICD-10-CM | POA: Diagnosis not present

## 2021-08-12 DIAGNOSIS — M9903 Segmental and somatic dysfunction of lumbar region: Secondary | ICD-10-CM | POA: Diagnosis not present

## 2021-08-12 DIAGNOSIS — M9905 Segmental and somatic dysfunction of pelvic region: Secondary | ICD-10-CM | POA: Diagnosis not present

## 2021-08-12 DIAGNOSIS — M50322 Other cervical disc degeneration at C5-C6 level: Secondary | ICD-10-CM | POA: Diagnosis not present

## 2021-08-12 DIAGNOSIS — M5031 Other cervical disc degeneration,  high cervical region: Secondary | ICD-10-CM | POA: Diagnosis not present

## 2021-08-12 DIAGNOSIS — M50323 Other cervical disc degeneration at C6-C7 level: Secondary | ICD-10-CM | POA: Diagnosis not present

## 2021-08-12 DIAGNOSIS — M50321 Other cervical disc degeneration at C4-C5 level: Secondary | ICD-10-CM | POA: Diagnosis not present

## 2021-08-12 DIAGNOSIS — M9904 Segmental and somatic dysfunction of sacral region: Secondary | ICD-10-CM | POA: Diagnosis not present

## 2021-08-12 DIAGNOSIS — M5413 Radiculopathy, cervicothoracic region: Secondary | ICD-10-CM | POA: Diagnosis not present

## 2021-08-15 DIAGNOSIS — M9903 Segmental and somatic dysfunction of lumbar region: Secondary | ICD-10-CM | POA: Diagnosis not present

## 2021-08-15 DIAGNOSIS — M4312 Spondylolisthesis, cervical region: Secondary | ICD-10-CM | POA: Diagnosis not present

## 2021-08-15 DIAGNOSIS — M50323 Other cervical disc degeneration at C6-C7 level: Secondary | ICD-10-CM | POA: Diagnosis not present

## 2021-08-15 DIAGNOSIS — M50321 Other cervical disc degeneration at C4-C5 level: Secondary | ICD-10-CM | POA: Diagnosis not present

## 2021-08-15 DIAGNOSIS — M50322 Other cervical disc degeneration at C5-C6 level: Secondary | ICD-10-CM | POA: Diagnosis not present

## 2021-08-15 DIAGNOSIS — M9901 Segmental and somatic dysfunction of cervical region: Secondary | ICD-10-CM | POA: Diagnosis not present

## 2021-08-15 DIAGNOSIS — M9904 Segmental and somatic dysfunction of sacral region: Secondary | ICD-10-CM | POA: Diagnosis not present

## 2021-08-15 DIAGNOSIS — M5413 Radiculopathy, cervicothoracic region: Secondary | ICD-10-CM | POA: Diagnosis not present

## 2021-08-15 DIAGNOSIS — M9905 Segmental and somatic dysfunction of pelvic region: Secondary | ICD-10-CM | POA: Diagnosis not present

## 2021-08-15 DIAGNOSIS — M5135 Other intervertebral disc degeneration, thoracolumbar region: Secondary | ICD-10-CM | POA: Diagnosis not present

## 2021-08-15 DIAGNOSIS — M5031 Other cervical disc degeneration,  high cervical region: Secondary | ICD-10-CM | POA: Diagnosis not present

## 2021-08-19 DIAGNOSIS — M4312 Spondylolisthesis, cervical region: Secondary | ICD-10-CM | POA: Diagnosis not present

## 2021-08-19 DIAGNOSIS — M5413 Radiculopathy, cervicothoracic region: Secondary | ICD-10-CM | POA: Diagnosis not present

## 2021-08-19 DIAGNOSIS — M50321 Other cervical disc degeneration at C4-C5 level: Secondary | ICD-10-CM | POA: Diagnosis not present

## 2021-08-19 DIAGNOSIS — M5031 Other cervical disc degeneration,  high cervical region: Secondary | ICD-10-CM | POA: Diagnosis not present

## 2021-08-19 DIAGNOSIS — M9903 Segmental and somatic dysfunction of lumbar region: Secondary | ICD-10-CM | POA: Diagnosis not present

## 2021-08-19 DIAGNOSIS — M5135 Other intervertebral disc degeneration, thoracolumbar region: Secondary | ICD-10-CM | POA: Diagnosis not present

## 2021-08-19 DIAGNOSIS — M50323 Other cervical disc degeneration at C6-C7 level: Secondary | ICD-10-CM | POA: Diagnosis not present

## 2021-08-19 DIAGNOSIS — M50322 Other cervical disc degeneration at C5-C6 level: Secondary | ICD-10-CM | POA: Diagnosis not present

## 2021-08-19 DIAGNOSIS — M9904 Segmental and somatic dysfunction of sacral region: Secondary | ICD-10-CM | POA: Diagnosis not present

## 2021-08-19 DIAGNOSIS — M9905 Segmental and somatic dysfunction of pelvic region: Secondary | ICD-10-CM | POA: Diagnosis not present

## 2021-08-19 DIAGNOSIS — M9901 Segmental and somatic dysfunction of cervical region: Secondary | ICD-10-CM | POA: Diagnosis not present

## 2021-08-20 DIAGNOSIS — M85851 Other specified disorders of bone density and structure, right thigh: Secondary | ICD-10-CM | POA: Diagnosis not present

## 2021-08-20 DIAGNOSIS — R7303 Prediabetes: Secondary | ICD-10-CM | POA: Diagnosis not present

## 2021-08-20 DIAGNOSIS — E7800 Pure hypercholesterolemia, unspecified: Secondary | ICD-10-CM | POA: Diagnosis not present

## 2021-08-20 DIAGNOSIS — Z780 Asymptomatic menopausal state: Secondary | ICD-10-CM | POA: Diagnosis not present

## 2021-08-20 DIAGNOSIS — G629 Polyneuropathy, unspecified: Secondary | ICD-10-CM | POA: Diagnosis not present

## 2021-08-20 DIAGNOSIS — M1990 Unspecified osteoarthritis, unspecified site: Secondary | ICD-10-CM | POA: Diagnosis not present

## 2021-08-20 DIAGNOSIS — E039 Hypothyroidism, unspecified: Secondary | ICD-10-CM | POA: Diagnosis not present

## 2021-08-21 ENCOUNTER — Ambulatory Visit (INDEPENDENT_AMBULATORY_CARE_PROVIDER_SITE_OTHER): Payer: Medicare HMO | Admitting: Family Medicine

## 2021-08-21 ENCOUNTER — Ambulatory Visit (INDEPENDENT_AMBULATORY_CARE_PROVIDER_SITE_OTHER): Payer: Medicare HMO

## 2021-08-21 ENCOUNTER — Ambulatory Visit: Payer: Self-pay

## 2021-08-21 VITALS — BP 138/82 | HR 90 | Ht 62.0 in | Wt 162.0 lb

## 2021-08-21 DIAGNOSIS — G8929 Other chronic pain: Secondary | ICD-10-CM

## 2021-08-21 DIAGNOSIS — M25561 Pain in right knee: Secondary | ICD-10-CM

## 2021-08-21 DIAGNOSIS — M5413 Radiculopathy, cervicothoracic region: Secondary | ICD-10-CM | POA: Diagnosis not present

## 2021-08-21 DIAGNOSIS — M25562 Pain in left knee: Secondary | ICD-10-CM | POA: Diagnosis not present

## 2021-08-21 DIAGNOSIS — M79672 Pain in left foot: Secondary | ICD-10-CM

## 2021-08-21 DIAGNOSIS — M25462 Effusion, left knee: Secondary | ICD-10-CM | POA: Diagnosis not present

## 2021-08-21 DIAGNOSIS — M79671 Pain in right foot: Secondary | ICD-10-CM | POA: Diagnosis not present

## 2021-08-21 DIAGNOSIS — M50323 Other cervical disc degeneration at C6-C7 level: Secondary | ICD-10-CM | POA: Diagnosis not present

## 2021-08-21 DIAGNOSIS — M5031 Other cervical disc degeneration,  high cervical region: Secondary | ICD-10-CM | POA: Diagnosis not present

## 2021-08-21 DIAGNOSIS — M9901 Segmental and somatic dysfunction of cervical region: Secondary | ICD-10-CM | POA: Diagnosis not present

## 2021-08-21 DIAGNOSIS — M50321 Other cervical disc degeneration at C4-C5 level: Secondary | ICD-10-CM | POA: Diagnosis not present

## 2021-08-21 DIAGNOSIS — M5135 Other intervertebral disc degeneration, thoracolumbar region: Secondary | ICD-10-CM | POA: Diagnosis not present

## 2021-08-21 DIAGNOSIS — M4312 Spondylolisthesis, cervical region: Secondary | ICD-10-CM | POA: Diagnosis not present

## 2021-08-21 DIAGNOSIS — M9904 Segmental and somatic dysfunction of sacral region: Secondary | ICD-10-CM | POA: Diagnosis not present

## 2021-08-21 DIAGNOSIS — M9905 Segmental and somatic dysfunction of pelvic region: Secondary | ICD-10-CM | POA: Diagnosis not present

## 2021-08-21 DIAGNOSIS — M50322 Other cervical disc degeneration at C5-C6 level: Secondary | ICD-10-CM | POA: Diagnosis not present

## 2021-08-21 DIAGNOSIS — M25461 Effusion, right knee: Secondary | ICD-10-CM | POA: Diagnosis not present

## 2021-08-21 DIAGNOSIS — M9903 Segmental and somatic dysfunction of lumbar region: Secondary | ICD-10-CM | POA: Diagnosis not present

## 2021-08-21 NOTE — Progress Notes (Signed)
I, Kayla Mueller, LAT, ATC acting as a scribe for Kayla Leader, MD.  Kayla Mueller is a 66 y.o. female who presents to [place] at [facility] today for low back and bilat knee pain. Pt was last seen by Dr. Georgina Mueller on 05/20/21 for bilat shoulder and R foot pain. Today, pt c/o LBP x 6 months.  Pt locates pain to bilat sides of her low back. Pt c/o tightness overnight and increased pain first thing in the mornings.  Radiates: no LE numbness/tingling: no LE weakness: no Aggravates: mornings Treatments tried: stretches, chiro  Pt also c/o bilat knee pain x 6 months. Pt c/o difficulty getting up off the floor. Pt reports hx of a R knee scope 20 years ago. Pt locates pain to   Knee swelling: no Mechanical symptoms: no Aggravates: stairs, getting up from sitting on the floor Treatments tried:   Pertinent review of systems: No fevers or chills  Relevant historical information: Atherosclerosis.   Exam:  BP 138/82   Pulse 90   Ht '5\' 2"'$  (1.575 m)   Wt 162 lb (73.5 kg)   SpO2 97%   BMI 29.63 kg/m  General: Well Developed, well nourished, and in no acute distress.   MSK: Right knee normal-appearing. Normal motion with crepitation.   Tender palpation medial joint line. Stable ligamentous exam. Intact strength.  Left knee: Normal-appearing normal motion with crepitation. Tender palpation medial joint line. Stable ligamentous exam. Intact strength.  Right foot: Bossing and tender dorsal midfoot especially around the second tarsometatarsal joint.  Mild bunion formation present normal foot and ankle motion.  Left foot: Positive tender dorsal midfoot especially around the second tarsometatarsal joint.  Mild bunion formation is present.  Normal foot and ankle motion.    Lab and Radiology Results  Procedure: Real-time Ultrasound Guided Injection of right foot second tarsometatarsal joint Device: Philips Affiniti 50G Images permanently stored and available for  review in PACS Verbal informed consent obtained.  Discussed risks and benefits of procedure. Warned about infection, bleeding, hyperglycemia damage to structures among others. Patient expresses understanding and agreement Time-out conducted.   Noted no overlying erythema, induration, or other signs of local infection.   Skin prepped in a sterile fashion.   Local anesthesia: Topical Ethyl chloride.   With sterile technique and under real time ultrasound guidance: 30 mg of Kenalog and 0.75 mL of Marcaine injected into TMT. Fluid seen entering the joint capsule.   Completed without difficulty   Pain immediately resolved suggesting accurate placement of the medication.   Advised to call if fevers/chills, erythema, induration, drainage, or persistent bleeding.   Images permanently stored and available for review in the ultrasound unit.  Impression: Technically successful ultrasound guided injection.    Procedure: Real-time Ultrasound Guided Injection of left foot second tarsometatarsal joint Device: Philips Affiniti 50G Images permanently stored and available for review in PACS Verbal informed consent obtained.  Discussed risks and benefits of procedure. Warned about infection, bleeding, hyperglycemia damage to structures among others. Patient expresses understanding and agreement Time-out conducted.   Noted no overlying erythema, induration, or other signs of local infection.   Skin prepped in a sterile fashion.   Local anesthesia: Topical Ethyl chloride.   With sterile technique and under real time ultrasound guidance: 30 mg of Kenalog and 0.75 mL of Marcaine injected into TMT. Fluid seen entering the joint capsule.   Completed without difficulty   Pain immediately resolved suggesting accurate placement of the medication.   Advised to call  if fevers/chills, erythema, induration, drainage, or persistent bleeding.   Images permanently stored and available for review in the ultrasound unit.   Impression: Technically successful ultrasound guided injection.    X-ray images bilateral knees obtained today personally and independently interpreted  Right knee: Medial DJD rated as moderate.  Mild to moderate patellofemoral DJD.  No acute fractures.  Left knee: Mild to moderate medial DJD.  Mild to moderate patellofemoral DJD.  No acute fractures.  Await formal radiology    Assessment and Plan: 66 y.o. 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  Its been about 4 months since her right midfoot injection and she is doing reasonably well.  She thinks the injection lasted about 3 months.  Plan for repeat injection right foot and first injection left foot today.  Maximize conservative management with good arch support Voltaren gel arch straps.  Knee pain bilaterally thought to be related to DJD.  Plan for hyaluronic acid injections as she would like to minimize steroid injections which I think is reasonable.  We will work on authorization now for HA injections.   PDMP not reviewed this encounter. Orders Placed This Encounter  Procedures   [place] LIMITED JOINT SPACE STRUCTURES LOW BILAT(NO LINKED CHARGES)    Order Specific Question:   Reason for Exam (SYMPTOM  OR DIAGNOSIS REQUIRED)    Answer:   bilateral knee pain    Order Specific Question:   Preferred imaging location?    Answer:   [place]   DG Knee AP/LAT W/Sun[place] Left    Standing Status:   Future    Standing Expiration Date:   09/21/2021    Order Specific Question:   Reason for Exam (SYMPTOM  OR DIAGNOSIS REQUIRED)    Answer:   bilateral knee pain    Order Specific Question:   Preferred imaging location?    Answer:   [facility]   DG Knee AP/LAT W/Sunrise Right    Standing Status:   Future    Standing Expiration Date:   09/21/2021    Order Specific Question:   Reason for Exam (SYMPTOM  OR DIAGNOSIS REQUIRED)    Answer:   bilateral knee pain    Order Specific Question:   Preferred  imaging location?    Answer:   [facility]   No orders of the defined types were placed in this encounter.    Discussed warning signs or symptoms. Please see discharge instructions. Patient expresses understanding.   The above documentation has been reviewed and is accurate and complete Kayla Mueller, M.D.

## 2021-08-21 NOTE — Progress Notes (Signed)
Note duplication 

## 2021-08-21 NOTE — Patient Instructions (Addendum)
Thank you for coming in today.   You received an injection today. Seek immediate medical attention if the joint becomes red, extremely painful, or is oozing fluid.   Please get an Xray today before you leave   We will work authorize gel shots for your knees. You will hear from our office once we get approval.

## 2021-08-22 NOTE — Progress Notes (Signed)
Right knee x-ray shows mild arthritis.

## 2021-08-22 NOTE — Progress Notes (Signed)
Left knee x-ray shows arthritis changes rated as mild.

## 2021-08-23 DIAGNOSIS — M5413 Radiculopathy, cervicothoracic region: Secondary | ICD-10-CM | POA: Diagnosis not present

## 2021-08-23 DIAGNOSIS — M9901 Segmental and somatic dysfunction of cervical region: Secondary | ICD-10-CM | POA: Diagnosis not present

## 2021-08-23 DIAGNOSIS — M50323 Other cervical disc degeneration at C6-C7 level: Secondary | ICD-10-CM | POA: Diagnosis not present

## 2021-08-23 DIAGNOSIS — M9904 Segmental and somatic dysfunction of sacral region: Secondary | ICD-10-CM | POA: Diagnosis not present

## 2021-08-23 DIAGNOSIS — M50322 Other cervical disc degeneration at C5-C6 level: Secondary | ICD-10-CM | POA: Diagnosis not present

## 2021-08-23 DIAGNOSIS — M9905 Segmental and somatic dysfunction of pelvic region: Secondary | ICD-10-CM | POA: Diagnosis not present

## 2021-08-23 DIAGNOSIS — M9903 Segmental and somatic dysfunction of lumbar region: Secondary | ICD-10-CM | POA: Diagnosis not present

## 2021-08-23 DIAGNOSIS — M5135 Other intervertebral disc degeneration, thoracolumbar region: Secondary | ICD-10-CM | POA: Diagnosis not present

## 2021-08-23 DIAGNOSIS — M50321 Other cervical disc degeneration at C4-C5 level: Secondary | ICD-10-CM | POA: Diagnosis not present

## 2021-08-23 DIAGNOSIS — M4312 Spondylolisthesis, cervical region: Secondary | ICD-10-CM | POA: Diagnosis not present

## 2021-08-23 DIAGNOSIS — M5031 Other cervical disc degeneration,  high cervical region: Secondary | ICD-10-CM | POA: Diagnosis not present

## 2021-08-25 ENCOUNTER — Encounter: Payer: Self-pay | Admitting: Neurology

## 2021-08-26 ENCOUNTER — Ambulatory Visit: Payer: Medicare HMO | Admitting: Neurology

## 2021-08-26 ENCOUNTER — Encounter: Payer: Self-pay | Admitting: Neurology

## 2021-08-26 VITALS — BP 144/74 | HR 71 | Wt 163.2 lb

## 2021-08-26 DIAGNOSIS — Z9989 Dependence on other enabling machines and devices: Secondary | ICD-10-CM

## 2021-08-26 DIAGNOSIS — G4733 Obstructive sleep apnea (adult) (pediatric): Secondary | ICD-10-CM

## 2021-08-26 DIAGNOSIS — R6889 Other general symptoms and signs: Secondary | ICD-10-CM

## 2021-08-26 NOTE — Patient Instructions (Addendum)
Please continue using your autoPAP regularly. While your insurance requires that you use PAP at least 4 hours each night on 70% of the nights, I recommend, that you not skip any nights and use it throughout the night if you can. Getting used to PAP and staying with the treatment long term does take time and patience and discipline. Untreated obstructive sleep apnea when it is moderate to severe can have an adverse impact on cardiovascular health and raise her risk for heart disease, arrhythmias, hypertension, congestive heart failure, stroke and diabetes. Untreated obstructive sleep apnea causes sleep disruption, nonrestorative sleep, and sleep deprivation. This can have an impact on your day to day functioning and cause daytime sleepiness and impairment of cognitive function, memory loss, mood disturbance, and problems focussing. Using PAP regularly can improve these symptoms.  For your sleepiness, I recommend that you be very cautious with the trazodone.  Please also talk to Dr. Shelia Media about reducing your gabapentin as you have had significant sleepiness, including while at the wheel.   Sleepiness and medication side effects could affect your cognitive functioning, being more consistent and using AutoPap longer-term may improve your cognitive function.  Please follow-up regularly with your primary care for now.   We can see you in 1 year, you can see one of our nurse practitioners as you are stable.

## 2021-08-26 NOTE — Progress Notes (Signed)
Subjective:    Patient ID: Kayla Mueller is a 66 y.o. female.  HPI    Interim history:   Kayla Mueller is a 66 year old right-handed woman with an underlying medical history of allergic rhinitis, hypothyroidism, arthritis with status post arthroscopic knee surgeries, breast cancer, and borderline overweight state, who presents for follow-up consultation of her obstructive sleep apnea after interim testing and starting AutoPap therapy.  I last saw her on 05/20/2021 for evaluation of paresthesias of both feet. She was on gabapentin per PCP, which was helping.  I suggested additional work-up with blood work.  We talked about potentially pursuing an EMG nerve conduction velocity test down the road. I had evaluated her for sleep apnea concern on 04/03/2021.   She had a home sleep test on 08/12/2021 which indicated moderate obstructive sleep apnea with a total AHI of 19.5/hour and O2 nadir of 86%.  Mild to moderate snoring was detected.  She was advised to proceed with AutoPap therapy.  Her set up date was 06/03/2021.  She has a ResMed air sense 11 AutoSet machine.  Today, 08/26/2021: I reviewed her AutoPap compliance data from 07/23/2021 through 08/21/2021, which is a total of 30 days, during which time she used her machine 29 days with percent use days greater than 4 hours at 83%, indicating very good compliance with an average usage of 6 hours and 17 minutes, residual AHI at goal at 2.1/h, average pressure for the 95th percentile at 9.8 cm with a range of 4 to 10 cm, leak on the higher side consistently, more so in the recent past with the 95th percentile at 38.2 L/min.  She reports feeling sleepy, she has had an increase in her gabapentin, to 300 mg twice daily.  She feels drowsy while driving.  She has had instances of the AutoPap machine turning off at random.  She has not had it looked at by her DME provider yet, her mask is leaking more and she attributes this to sleeping on her sides more.  When she  first got her machine she was trying to sleep more on her back.  She uses an under the nose style fullface mask.  She is motivated to continue with treatment.  She feels forgetful.  She has had a checkup with her primary care and additional blood work, reports that she was told that she is prediabetic.    The patient's allergies, current medications, family history, past medical history, past social history, past surgical history and problem list were reviewed and updated as appropriate.    Previously:   05/20/21: (She) presents for evaluation of her paresthesias, concern for neuropathy.  She is referred by her primary care physician.  She reports a 1+ year history of intermittent numbness and tingling affecting both feet as well as pain and burning sensation affecting both feet.  Gabapentin has been helpful, she takes 300 mg daily.  She had recent blood work and has a follow-up appointment to discuss this tomorrow.  She is not sure what all was checked but reports that she recently gained weight in the realm of 20 pounds and is worried that her prediabetes has become worse.  She tapered herself off of Cymbalta and Wellbutrin.  She is following with a counselor for her mood and it has been helpful but she does good tears during the appointment at 1 point.  She has arthritis, she has a family history of rheumatoid arthritis.  She had a recent steroid injection into the right forefoot.  She has had some numbness affecting primarily the right big toe, other symptoms are more intermittent.  I recently evaluated her for daytime somnolence, concern for sleep apnea.  She had a home sleep test on 05/13/2021, which indicated moderate obstructive sleep apnea with an AHI of 19.5/h, O2 nadir 86% with mild to moderate snoring detected.  She was advised to proceed with AutoPap therapy.  She is on gabapentin.  She is also on duloxetine and takes trazodone at night for sleep.  She had blood work through Dr. Denita Lung office and  I was able to review results from 08/10/2020: A1c was 5.8, total cholesterol 136, triglycerides 57, LDL 73, hepatic panel showed total protein of 6.3, albumin 3.8, alk phos 90, ALT 25, AST 16. In March 2022, her CBC with differential was benign, WBC was 6.5, RBC was 4.63, hemoglobin 13.8, hematocrit 42.1, CMP showed glucose of 100, BUN 15, creatinine 0.83.   We talked about her sleep test results and she would be willing to pursue AutoPap therapy.  She also reports memory issues and a family history of dementia.     04/03/21: (She) reports snoring and excessive daytime somnolence.  She has noted intermittent forgetfulness. I reviewed your office note from 02/28/2021.  She was advised to change gabapentin to bedtime.  Her duloxetine was decreased to 30 mg from 60 mg.  She was advised to continue with bupropion.  She has been on gabapentin for symptoms of neuropathy.  She has been on trazodone at bedtime, 50 mg strength 1-2 nightly as needed.  She reports that she is no longer on Wellbutrin.  Epworth sleepiness score is 18 out of 24, fatigue severity score is 43 out of 63.  She is widowed, she lives alone, she has 2 grown children.  Her sister has sleep apnea and has a CPAP machine.  Her sister has encouraged her to get checked out for sleep apnea due to her snoring.  Reports a bedtime between 11 and midnight and rise time between 8 and 8:30 AM.  She has 2 dogs in the household, she endorses nocturia about once or twice per average night, she denies recurrent morning headaches.  She does not drink any caffeine on a daily basis, she is a non-smoker and drinks alcohol occasionally.  She has left-sided TMJ dysfunction.  Her husband passed away.     Her Past Medical History Is Significant For: Past Medical History:  Diagnosis Date   Allergic rhinitis    Breast cancer ([place])    right   Hypothyroid     Her Past Surgical History Is Significant For: Past Surgical History:  Procedure Laterality Date   bilateral  arthroscopic knee surgery     BREAST LUMPECTOMY Right    about 12 years ago   CESAREAN SECTION     x2    Her Family History Is Significant For: Family History  Problem Relation Age of Onset   Stroke Mother    Stroke Father    Sleep apnea Sister    Neuropathy Half-Sibling     Her Social History Is Significant For: Social History   Socioeconomic History   Marital status: Widowed    Spouse name: Not on file   Number of children: Not on file   Years of education: Not on file   Highest education level: Not on file  Occupational History   Not on file  Tobacco Use   Smoking status: Never   Smokeless tobacco: Never  Substance and Sexual Activity  Alcohol use: Yes    Comment: social   Drug use: Not on file   Sexual activity: Not on file  Other Topics Concern   Not on file  Social History Narrative   Caffeine: 1-2 cups/day   Social Determinants of Health   Financial Resource Strain: Not on file  Food Insecurity: Not on file  Transportation Needs: Not on file  Physical Activity: Not on file  Stress: Not on file  Social Connections: Not on file    Her Allergies Are:  No Known Allergies:   Her Current Medications Are:  Outpatient Encounter Medications as of 08/26/2021  Medication Sig   aspirin 81 MG EC tablet Take 81 mg by mouth daily.   Calcium Carb-Cholecalciferol (CALCIUM 600+D3 PO) Take 1 tablet by mouth daily.   famotidine (PEPCID) 20 MG tablet Take 20 mg by mouth at bedtime as needed.   fluticasone (FLONASE) 50 MCG/ACT nasal spray Place 2 sprays into both nostrils daily.   gabapentin (NEURONTIN) 300 MG capsule Take 1 capsule by mouth daily.   levothyroxine (SYNTHROID) 137 MCG tablet TAKE 1 TABLET ONCE DAILY INTHE MORNING ON AN EMPTY    STOMACH   metFORMIN (GLUCOPHAGE-XR) 500 MG 24 hr tablet 500 mg daily with breakfast.   Multiple Vitamin (MULTIVITAMIN ADULT) TABS 1 tablet   Omega 3 1000 MG CAPS Take 1 capsule by mouth daily.   traZODone (DESYREL) 50 MG tablet  Take 1-2 tablets by mouth as needed.   [DISCONTINUED] ASPIRIN 81 PO Take by mouth.   [DISCONTINUED] cefpodoxime (VANTIN) 200 MG tablet Take 1 tablet by mouth in the morning and at bedtime.   [DISCONTINUED] gabapentin (NEURONTIN) 100 MG capsule Take 100 mg by mouth 3 (three) times daily.   [DISCONTINUED] levothyroxine (SYNTHROID) 112 MCG tablet Take 112 mcg by mouth daily before breakfast.   [DISCONTINUED] rosuvastatin (CRESTOR) 20 MG tablet Take 20 mg by mouth daily. (Patient not taking: Reported on 08/26/2021)   No facility-administered encounter medications on file as of 08/26/2021.  :  Review of Systems:  Out of a complete 14 point review of systems, all are reviewed and negative with the exception of these symptoms as listed below:  Review of Systems  Neurological:        Pt is using machine,  [facility] is asking about getting update for insurance compliance. She stated that the machine has stopped working 2 nights (not knowing why).  [facility] is her DME.  She has not been back in touch about that.  ESS 20/ FSS 38. Is asking about memory loss again.     Objective:  Neurological Exam  Physical Exam Physical Examination:   Vitals:   08/26/21 1305  BP: (!) 144/74  Pulse: 71   General Examination: The patient is a very pleasant 66 y.o. female in no acute distress. She appears well-developed and well-nourished and well groomed.   HEENT: Normocephalic, atraumatic, pupils are equal, round and reactive to light, tracking well-preserved.  Face is symmetric with normal facial animation.  Hearing is grossly intact. Speech is clear with no dysarthria noted. There is no hypophonia. There is no lip, neck/head, jaw or voice tremor. Neck is supple with full range of passive and active motion. There are no carotid bruits on auscultation. Oropharynx exam reveals: Mild mouth dryness, good dental hygiene, tongue protrudes centrally and palate elevates symmetrically.     Chest: Clear to auscultation  without wheezing, rhonchi or crackles noted.   Heart: S1+S2+0, regular and normal without murmurs, rubs or gallops  noted.    Abdomen: Soft, non-tender and non-distended.   Extremities: There is no pitting edema in the distal lower extremities bilaterally.    Skin: Warm and dry without trophic changes noted.    Musculoskeletal: exam reveals no obvious joint deformities.    Neurologically:  Mental status: The patient is awake, alert and oriented in all 4 spheres. Her immediate and remote memory, attention, language skills and fund of knowledge are appropriate. There is no evidence of aphasia, agnosia, apraxia or anomia. Speech is clear with normal prosody and enunciation. Thought process is linear. Mood is normal and affect is normal.  Cranial nerves II - XII are as described above under HEENT exam.  Motor exam: Normal bulk, strength and tone is noted. There is no obvious tremor. Fine motor skills and coordination: grossly intact.   Cerebellar testing: No dysmetria or intention tremor. There is no truncal or gait ataxia.   Sensory exam: intact to light touch.  Gait, station and balance: She stands easily. No veering to one side is noted. No leaning to one side is noted. Posture is age-appropriate and stance is narrow based. Gait shows normal stride length and normal pace. No problems turning are noted.     Assessment and plan:  In summary, Kayla Mueller is a very pleasant 66 year old female with an underlying medical history of allergic rhinitis, hypothyroidism, arthritis with status post arthroscopic knee surgeries, breast cancer, and borderline overweight state, who presents for follow-up consultation of her obstructive sleep apnea which was deemed in the moderate range by home sleep testing on 05/23/2021.  She has been on gabapentin for paresthesias and this was recently increased by PCP to 300 mg twice daily.  She has had increase in sleepiness.  She is advised to talk to her primary care  about reducing her gabapentin.  We talked about forgetfulness.  She had additional blood work through her primary care.  She has had recent medication changes, no longer takes any antidepressant medication.  She has seen psychology before.  She is advised to follow-up with her primary care on a regular basis.  She is advised that medication side effects and sleepiness can affect cognitive function and treating sleep apnea may help cognitive function.  She is compliant with treatment but has had an increase in air leak from the mask.  She is motivated to continue with her AutoPap.  She is commended for her treatment adherence.  She is advised to make an appointment with her DME provider to troubleshoot the machine as it had turned off a few times on its own.  She is also advised to see if she needs a new mask or a different style or just needs to adjust the headgear.  She is encouraged to bring the machine and the mask in for a recheck with her DME provider.  At this juncture, she is advised to follow-up routinely in this clinic in 1 year to see one of our nurse practitioners, sooner if needed.  I answered all her questions today and she was in agreement.  I spent 25 minutes in total face-to-face time and in reviewing records during pre-charting, more than 50% of which was spent in counseling and coordination of care, reviewing test results, reviewing medications and treatment regimen and/or in discussing or reviewing the diagnosis of OSA, the prognosis and treatment options. Pertinent laboratory and imaging test results that were available during this visit with the patient were reviewed by me and considered in my medical decision  making (see chart for details).

## 2021-08-27 DIAGNOSIS — M5413 Radiculopathy, cervicothoracic region: Secondary | ICD-10-CM | POA: Diagnosis not present

## 2021-08-27 DIAGNOSIS — M5135 Other intervertebral disc degeneration, thoracolumbar region: Secondary | ICD-10-CM | POA: Diagnosis not present

## 2021-08-27 DIAGNOSIS — M4312 Spondylolisthesis, cervical region: Secondary | ICD-10-CM | POA: Diagnosis not present

## 2021-08-27 DIAGNOSIS — M50322 Other cervical disc degeneration at C5-C6 level: Secondary | ICD-10-CM | POA: Diagnosis not present

## 2021-08-27 DIAGNOSIS — M9903 Segmental and somatic dysfunction of lumbar region: Secondary | ICD-10-CM | POA: Diagnosis not present

## 2021-08-27 DIAGNOSIS — M50321 Other cervical disc degeneration at C4-C5 level: Secondary | ICD-10-CM | POA: Diagnosis not present

## 2021-08-27 DIAGNOSIS — M50323 Other cervical disc degeneration at C6-C7 level: Secondary | ICD-10-CM | POA: Diagnosis not present

## 2021-08-27 DIAGNOSIS — M5031 Other cervical disc degeneration,  high cervical region: Secondary | ICD-10-CM | POA: Diagnosis not present

## 2021-08-27 DIAGNOSIS — M9905 Segmental and somatic dysfunction of pelvic region: Secondary | ICD-10-CM | POA: Diagnosis not present

## 2021-08-27 DIAGNOSIS — M9904 Segmental and somatic dysfunction of sacral region: Secondary | ICD-10-CM | POA: Diagnosis not present

## 2021-08-27 DIAGNOSIS — M9901 Segmental and somatic dysfunction of cervical region: Secondary | ICD-10-CM | POA: Diagnosis not present

## 2021-08-29 DIAGNOSIS — M9904 Segmental and somatic dysfunction of sacral region: Secondary | ICD-10-CM | POA: Diagnosis not present

## 2021-08-29 DIAGNOSIS — M5413 Radiculopathy, cervicothoracic region: Secondary | ICD-10-CM | POA: Diagnosis not present

## 2021-08-29 DIAGNOSIS — M50321 Other cervical disc degeneration at C4-C5 level: Secondary | ICD-10-CM | POA: Diagnosis not present

## 2021-08-29 DIAGNOSIS — M9901 Segmental and somatic dysfunction of cervical region: Secondary | ICD-10-CM | POA: Diagnosis not present

## 2021-08-29 DIAGNOSIS — M4312 Spondylolisthesis, cervical region: Secondary | ICD-10-CM | POA: Diagnosis not present

## 2021-08-29 DIAGNOSIS — M9903 Segmental and somatic dysfunction of lumbar region: Secondary | ICD-10-CM | POA: Diagnosis not present

## 2021-08-29 DIAGNOSIS — M9905 Segmental and somatic dysfunction of pelvic region: Secondary | ICD-10-CM | POA: Diagnosis not present

## 2021-08-29 DIAGNOSIS — M50322 Other cervical disc degeneration at C5-C6 level: Secondary | ICD-10-CM | POA: Diagnosis not present

## 2021-08-29 DIAGNOSIS — M5031 Other cervical disc degeneration,  high cervical region: Secondary | ICD-10-CM | POA: Diagnosis not present

## 2021-08-29 DIAGNOSIS — M50323 Other cervical disc degeneration at C6-C7 level: Secondary | ICD-10-CM | POA: Diagnosis not present

## 2021-08-29 DIAGNOSIS — M5135 Other intervertebral disc degeneration, thoracolumbar region: Secondary | ICD-10-CM | POA: Diagnosis not present

## 2021-09-03 DIAGNOSIS — G4733 Obstructive sleep apnea (adult) (pediatric): Secondary | ICD-10-CM | POA: Diagnosis not present

## 2021-09-06 DIAGNOSIS — G4733 Obstructive sleep apnea (adult) (pediatric): Secondary | ICD-10-CM | POA: Diagnosis not present

## 2021-09-12 DIAGNOSIS — M4312 Spondylolisthesis, cervical region: Secondary | ICD-10-CM | POA: Diagnosis not present

## 2021-09-12 DIAGNOSIS — M50322 Other cervical disc degeneration at C5-C6 level: Secondary | ICD-10-CM | POA: Diagnosis not present

## 2021-09-12 DIAGNOSIS — M5031 Other cervical disc degeneration,  high cervical region: Secondary | ICD-10-CM | POA: Diagnosis not present

## 2021-09-12 DIAGNOSIS — M9901 Segmental and somatic dysfunction of cervical region: Secondary | ICD-10-CM | POA: Diagnosis not present

## 2021-09-12 DIAGNOSIS — M9905 Segmental and somatic dysfunction of pelvic region: Secondary | ICD-10-CM | POA: Diagnosis not present

## 2021-09-12 DIAGNOSIS — M50321 Other cervical disc degeneration at C4-C5 level: Secondary | ICD-10-CM | POA: Diagnosis not present

## 2021-09-12 DIAGNOSIS — M9904 Segmental and somatic dysfunction of sacral region: Secondary | ICD-10-CM | POA: Diagnosis not present

## 2021-09-12 DIAGNOSIS — M5135 Other intervertebral disc degeneration, thoracolumbar region: Secondary | ICD-10-CM | POA: Diagnosis not present

## 2021-09-12 DIAGNOSIS — M9903 Segmental and somatic dysfunction of lumbar region: Secondary | ICD-10-CM | POA: Diagnosis not present

## 2021-09-12 DIAGNOSIS — M5413 Radiculopathy, cervicothoracic region: Secondary | ICD-10-CM | POA: Diagnosis not present

## 2021-09-12 DIAGNOSIS — M50323 Other cervical disc degeneration at C6-C7 level: Secondary | ICD-10-CM | POA: Diagnosis not present

## 2021-09-13 DIAGNOSIS — E039 Hypothyroidism, unspecified: Secondary | ICD-10-CM | POA: Diagnosis not present

## 2021-09-23 DIAGNOSIS — Z7982 Long term (current) use of aspirin: Secondary | ICD-10-CM | POA: Diagnosis not present

## 2021-09-23 DIAGNOSIS — G4733 Obstructive sleep apnea (adult) (pediatric): Secondary | ICD-10-CM | POA: Diagnosis not present

## 2021-09-23 DIAGNOSIS — E039 Hypothyroidism, unspecified: Secondary | ICD-10-CM | POA: Diagnosis not present

## 2021-09-23 DIAGNOSIS — M1990 Unspecified osteoarthritis, unspecified site: Secondary | ICD-10-CM | POA: Diagnosis not present

## 2021-09-23 DIAGNOSIS — I700 Atherosclerosis of aorta: Secondary | ICD-10-CM | POA: Diagnosis not present

## 2021-09-23 DIAGNOSIS — K219 Gastro-esophageal reflux disease without esophagitis: Secondary | ICD-10-CM | POA: Diagnosis not present

## 2021-09-23 DIAGNOSIS — E785 Hyperlipidemia, unspecified: Secondary | ICD-10-CM | POA: Diagnosis not present

## 2021-09-23 DIAGNOSIS — R69 Illness, unspecified: Secondary | ICD-10-CM | POA: Diagnosis not present

## 2021-09-23 DIAGNOSIS — I2510 Atherosclerotic heart disease of native coronary artery without angina pectoris: Secondary | ICD-10-CM | POA: Diagnosis not present

## 2021-09-23 DIAGNOSIS — E119 Type 2 diabetes mellitus without complications: Secondary | ICD-10-CM | POA: Diagnosis not present

## 2021-09-23 DIAGNOSIS — Z833 Family history of diabetes mellitus: Secondary | ICD-10-CM | POA: Diagnosis not present

## 2021-09-23 DIAGNOSIS — Z7984 Long term (current) use of oral hypoglycemic drugs: Secondary | ICD-10-CM | POA: Diagnosis not present

## 2021-10-04 DIAGNOSIS — G4733 Obstructive sleep apnea (adult) (pediatric): Secondary | ICD-10-CM | POA: Diagnosis not present

## 2021-11-03 DIAGNOSIS — G4733 Obstructive sleep apnea (adult) (pediatric): Secondary | ICD-10-CM | POA: Diagnosis not present

## 2021-11-05 DIAGNOSIS — M9903 Segmental and somatic dysfunction of lumbar region: Secondary | ICD-10-CM | POA: Diagnosis not present

## 2021-11-05 DIAGNOSIS — M9901 Segmental and somatic dysfunction of cervical region: Secondary | ICD-10-CM | POA: Diagnosis not present

## 2021-11-05 DIAGNOSIS — M4312 Spondylolisthesis, cervical region: Secondary | ICD-10-CM | POA: Diagnosis not present

## 2021-11-05 DIAGNOSIS — M9905 Segmental and somatic dysfunction of pelvic region: Secondary | ICD-10-CM | POA: Diagnosis not present

## 2021-11-05 DIAGNOSIS — M9904 Segmental and somatic dysfunction of sacral region: Secondary | ICD-10-CM | POA: Diagnosis not present

## 2021-11-05 DIAGNOSIS — M50321 Other cervical disc degeneration at C4-C5 level: Secondary | ICD-10-CM | POA: Diagnosis not present

## 2021-11-05 DIAGNOSIS — M50323 Other cervical disc degeneration at C6-C7 level: Secondary | ICD-10-CM | POA: Diagnosis not present

## 2021-11-05 DIAGNOSIS — M5413 Radiculopathy, cervicothoracic region: Secondary | ICD-10-CM | POA: Diagnosis not present

## 2021-11-05 DIAGNOSIS — M5031 Other cervical disc degeneration,  high cervical region: Secondary | ICD-10-CM | POA: Diagnosis not present

## 2021-11-05 DIAGNOSIS — M5135 Other intervertebral disc degeneration, thoracolumbar region: Secondary | ICD-10-CM | POA: Diagnosis not present

## 2021-11-05 DIAGNOSIS — M50322 Other cervical disc degeneration at C5-C6 level: Secondary | ICD-10-CM | POA: Diagnosis not present

## 2021-11-06 ENCOUNTER — Ambulatory Visit (INDEPENDENT_AMBULATORY_CARE_PROVIDER_SITE_OTHER): Payer: Medicare HMO

## 2021-11-06 ENCOUNTER — Ambulatory Visit: Payer: Medicare HMO | Admitting: Family Medicine

## 2021-11-06 VITALS — BP 112/76 | HR 91 | Ht 62.0 in | Wt 163.0 lb

## 2021-11-06 DIAGNOSIS — M25551 Pain in right hip: Secondary | ICD-10-CM | POA: Diagnosis not present

## 2021-11-06 DIAGNOSIS — M545 Low back pain, unspecified: Secondary | ICD-10-CM | POA: Diagnosis not present

## 2021-11-06 MED ORDER — GABAPENTIN 300 MG PO CAPS: 300.0000 mg | ORAL_CAPSULE | Freq: Three times a day (TID) | ORAL | 3 refills | Status: DC | PRN

## 2021-11-06 MED ORDER — PREDNISONE 50 MG PO TABS: 50.0000 mg | ORAL_TABLET | Freq: Every day | ORAL | 0 refills | Status: DC

## 2021-11-06 NOTE — Progress Notes (Unsigned)
I, Peterson Lombard, LAT, ATC acting as a scribe for [name], MD.  Kayla Mueller is a 66 y.o. female who presents to [facility] today for R thigh pain. Pt was previously seen by Dr. Georgina Snell on 08/21/21 for chronic bilat knee pain. Today, pt c/o R thigh pain ongoing since Sunday. Pt was just playing w/ her grandkids and walking around when the pain started. Pt sees a chiro who told her her "L2 is slipping out of alignment," Pt's last adjustment was 10/3. Pt locates the lateral aspect of the hip to the lateral and anterior aspect of the R thigh.  Low back pain: no currently, but hx Radiating pain: yes LE numbness/tingling: no LE weakness: yes Aggravates: everything, difficulty sleeping Treatments tried: chiro, creams, ice, heat, Tylenol   Pertinent review of systems: No fevers or chills  Relevant historical information: Breast cancer history   Exam:  BP 112/76   Pulse 91   Ht '5\' 2"'$  (1.575 m)   Wt 163 lb (73.9 kg)   SpO2 97%   BMI 29.81 kg/m  General: Well Developed, well nourished, and in no acute distress.   MSK: Right hip: Normal-appearing Tender palpation greater trochanter.  Normal hip motion.  Pain with rotation and abduction pain with resisted hip abduction and external rotation. Strength to abduction and external rotation are diminished 4/5. Pulses capillary refill and sensation are intact distally.    Lab and Radiology Results  X-ray images L-spine and right hip obtained today personally and independently interpreted  L-spine: No acute fractures are present.  Mild degenerative changes.  Minimal aortic atherosclerosis is present.  Right hip: Moderate femoral acetabular DJD.  No acute fractures are present. Mild to moderate sclerosis/DJD/pubic symphysis.  Await formal radiology review  Hip greater trochanteric injection: right Consent obtained and timeout performed. Area of maximum tenderness palpated and identified. Skin  cleaned with alcohol, cold spray applied. A spinal needle was used to access the greater trochanteric bursa. '40mg'$  of Kenalog and 2 mL of Marcaine were used to inject the trochanteric bursa. Patient tolerated the procedure well.      Assessment and Plan: 66 y.o. female with right thigh pain.  Pain is located primarily to the anterior and lateral hip and thigh.  Pain thought primarily due to hip abductor tendinopathy.  She may have a component of pain due to hip DJD as well.  Doubtful for lumbar radiculopathy.  Plan for greater trochanter injection today.  Backup plan for prednisone in case her symptoms worsen along with trial of gabapentin in case symptoms worsen. Based on x-ray appearance the next step may be intra-articular hip injection.  PDMP not reviewed this encounter. Orders Placed This Encounter  Procedures   DG Lumbar Spine 2-3 Views    Standing Status:   Future    Number of Occurrences:   1    Standing Expiration Date:   12/07/2021    Order Specific Question:   Reason for Exam (SYMPTOM  OR DIAGNOSIS REQUIRED)    Answer:   right thigh pain    Order Specific Question:   Preferred imaging location?    Answer:   [facility]   DG HIP UNILAT W OR W/O PELVIS 2-3 VIEWS RIGHT    Standing Status:   Future    Number of Occurrences:   1    Standing Expiration Date:   12/07/2021    Order Specific Question:   Reason for Exam (SYMPTOM  OR DIAGNOSIS REQUIRED)  Answer:   right hip pain    Order Specific Question:   Preferred imaging location?    Answer:   [facility]   Meds ordered this encounter  Medications   predniSONE (DELTASONE) 50 MG tablet    Sig: Take 1 tablet (50 mg total) by mouth daily.    Dispense:  5 tablet    Refill:  0   gabapentin (NEURONTIN) 300 MG capsule    Sig: Take 1 capsule (300 mg total) by mouth 3 (three) times daily as needed.    Dispense:  90 capsule    Refill:  3     Discussed warning signs or symptoms. Please see discharge  instructions. Patient expresses understanding.   The above documentation has been reviewed and is accurate and complete [name], M.D.

## 2021-11-06 NOTE — Patient Instructions (Addendum)
Thank you for coming in today.   Please get an Xray today before you leave   You received an injection today. Seek immediate medical attention if the joint becomes red, extremely painful, or is oozing fluid.    Take the prednisone if not better.   Let me know if you are not improving.

## 2021-11-08 ENCOUNTER — Telehealth: Payer: Self-pay | Admitting: Family Medicine

## 2021-11-08 MED ORDER — PREGABALIN 75 MG PO CAPS: 75.0000 mg | ORAL_CAPSULE | Freq: Two times a day (BID) | ORAL | 3 refills | Status: DC | PRN

## 2021-11-08 NOTE — Telephone Encounter (Signed)
Lets try Lyrica.  It works similarly to gabapentin but a little differently.  It might work better.  That has prescribed to the [facility] in [place].  Prednisone really could help here.  I encourage you to take it.

## 2021-11-08 NOTE — Telephone Encounter (Signed)
Called pt and informed her. Encouraged her to take the prednisone. Advised if she worsened over the weekend to go to the ED or UC. Pt expressed concern/frustration that her XR's were not yet read by radiology. Pt advised that we would f/u on the status of these reads.

## 2021-11-08 NOTE — Telephone Encounter (Signed)
Patient called stating that the Gabapentin does not seem to helping her pain. She asked if there was something else Dr Georgina Snell would recommend?  She also said that she heard the Prednosine can make you jittery? So she is hesitant to take it?  Please advise.

## 2021-11-11 ENCOUNTER — Telehealth: Payer: Self-pay | Admitting: Family Medicine

## 2021-11-11 DIAGNOSIS — M25551 Pain in right hip: Secondary | ICD-10-CM

## 2021-11-11 DIAGNOSIS — M9901 Segmental and somatic dysfunction of cervical region: Secondary | ICD-10-CM | POA: Diagnosis not present

## 2021-11-11 DIAGNOSIS — M50323 Other cervical disc degeneration at C6-C7 level: Secondary | ICD-10-CM | POA: Diagnosis not present

## 2021-11-11 DIAGNOSIS — M9903 Segmental and somatic dysfunction of lumbar region: Secondary | ICD-10-CM | POA: Diagnosis not present

## 2021-11-11 DIAGNOSIS — M50321 Other cervical disc degeneration at C4-C5 level: Secondary | ICD-10-CM | POA: Diagnosis not present

## 2021-11-11 DIAGNOSIS — M9904 Segmental and somatic dysfunction of sacral region: Secondary | ICD-10-CM | POA: Diagnosis not present

## 2021-11-11 DIAGNOSIS — M5413 Radiculopathy, cervicothoracic region: Secondary | ICD-10-CM | POA: Diagnosis not present

## 2021-11-11 DIAGNOSIS — M5031 Other cervical disc degeneration,  high cervical region: Secondary | ICD-10-CM | POA: Diagnosis not present

## 2021-11-11 DIAGNOSIS — M9905 Segmental and somatic dysfunction of pelvic region: Secondary | ICD-10-CM | POA: Diagnosis not present

## 2021-11-11 DIAGNOSIS — M50322 Other cervical disc degeneration at C5-C6 level: Secondary | ICD-10-CM | POA: Diagnosis not present

## 2021-11-11 DIAGNOSIS — M5135 Other intervertebral disc degeneration, thoracolumbar region: Secondary | ICD-10-CM | POA: Diagnosis not present

## 2021-11-11 DIAGNOSIS — M5416 Radiculopathy, lumbar region: Secondary | ICD-10-CM

## 2021-11-11 DIAGNOSIS — M4312 Spondylolisthesis, cervical region: Secondary | ICD-10-CM | POA: Diagnosis not present

## 2021-11-11 MED ORDER — OXYCODONE-ACETAMINOPHEN 5-325 MG PO TABS: 1.0000 | ORAL_TABLET | Freq: Three times a day (TID) | ORAL | 0 refills | Status: DC | PRN

## 2021-11-11 NOTE — Progress Notes (Signed)
Right hip x-ray shows some mild arthritis.

## 2021-11-11 NOTE — Progress Notes (Signed)
Lumbar spine x-ray shows arthritis changes in the low back.

## 2021-11-11 NOTE — Telephone Encounter (Signed)
Kayla Mueller called.  She is having worsening pain especially pain radiating down her leg in a radicular manner.  She continues to have severe hip pain.  Plan for MRI lumbar spine and hip to further characterize source of escalating pain.  Additionally prescribed oxycodone.  Recheck after MRI.

## 2021-11-12 ENCOUNTER — Telehealth: Payer: Self-pay | Admitting: Family Medicine

## 2021-11-12 NOTE — Telephone Encounter (Signed)
Spoke to patient and discussed her status.  Patient was encouraged to be patient and wait for the MRI that was just ordered yesterday, and that this could take up to 1 week.  Discussed with patient that she does have the oxycodone the Dr. Georgina Snell sent in for her yesterday, and to let us know if that is not enough to get by and we could possibly refill the prednisone at that time.  Patient verbalized understanding

## 2021-11-12 NOTE — Telephone Encounter (Signed)
Pt calling to give an update on her condition.  Her knee went "out" 3 times yesterday and she fell. No injuries and unsure if it is her knee or ankle.  R foot "quivering" as she tries to sleep  On the 5th day of Prednisone, thinks it has helped and wonders if Dr. Georgina Snell wants to refill.  Pt questioned status of MRI, just ordered yesterday.

## 2021-11-13 DIAGNOSIS — M9904 Segmental and somatic dysfunction of sacral region: Secondary | ICD-10-CM | POA: Diagnosis not present

## 2021-11-13 DIAGNOSIS — M50322 Other cervical disc degeneration at C5-C6 level: Secondary | ICD-10-CM | POA: Diagnosis not present

## 2021-11-13 DIAGNOSIS — M50323 Other cervical disc degeneration at C6-C7 level: Secondary | ICD-10-CM | POA: Diagnosis not present

## 2021-11-13 DIAGNOSIS — M5135 Other intervertebral disc degeneration, thoracolumbar region: Secondary | ICD-10-CM | POA: Diagnosis not present

## 2021-11-13 DIAGNOSIS — M9905 Segmental and somatic dysfunction of pelvic region: Secondary | ICD-10-CM | POA: Diagnosis not present

## 2021-11-13 DIAGNOSIS — M50321 Other cervical disc degeneration at C4-C5 level: Secondary | ICD-10-CM | POA: Diagnosis not present

## 2021-11-13 DIAGNOSIS — M9903 Segmental and somatic dysfunction of lumbar region: Secondary | ICD-10-CM | POA: Diagnosis not present

## 2021-11-13 DIAGNOSIS — M5031 Other cervical disc degeneration,  high cervical region: Secondary | ICD-10-CM | POA: Diagnosis not present

## 2021-11-13 DIAGNOSIS — M5413 Radiculopathy, cervicothoracic region: Secondary | ICD-10-CM | POA: Diagnosis not present

## 2021-11-13 DIAGNOSIS — M9901 Segmental and somatic dysfunction of cervical region: Secondary | ICD-10-CM | POA: Diagnosis not present

## 2021-11-13 DIAGNOSIS — M4312 Spondylolisthesis, cervical region: Secondary | ICD-10-CM | POA: Diagnosis not present

## 2021-11-15 ENCOUNTER — Telehealth: Payer: Self-pay

## 2021-11-15 DIAGNOSIS — M9905 Segmental and somatic dysfunction of pelvic region: Secondary | ICD-10-CM | POA: Diagnosis not present

## 2021-11-15 DIAGNOSIS — M9901 Segmental and somatic dysfunction of cervical region: Secondary | ICD-10-CM | POA: Diagnosis not present

## 2021-11-15 DIAGNOSIS — M4312 Spondylolisthesis, cervical region: Secondary | ICD-10-CM | POA: Diagnosis not present

## 2021-11-15 DIAGNOSIS — M5413 Radiculopathy, cervicothoracic region: Secondary | ICD-10-CM | POA: Diagnosis not present

## 2021-11-15 DIAGNOSIS — M50322 Other cervical disc degeneration at C5-C6 level: Secondary | ICD-10-CM | POA: Diagnosis not present

## 2021-11-15 DIAGNOSIS — M5135 Other intervertebral disc degeneration, thoracolumbar region: Secondary | ICD-10-CM | POA: Diagnosis not present

## 2021-11-15 DIAGNOSIS — M9904 Segmental and somatic dysfunction of sacral region: Secondary | ICD-10-CM | POA: Diagnosis not present

## 2021-11-15 DIAGNOSIS — M9903 Segmental and somatic dysfunction of lumbar region: Secondary | ICD-10-CM | POA: Diagnosis not present

## 2021-11-15 DIAGNOSIS — M50323 Other cervical disc degeneration at C6-C7 level: Secondary | ICD-10-CM | POA: Diagnosis not present

## 2021-11-15 DIAGNOSIS — M50321 Other cervical disc degeneration at C4-C5 level: Secondary | ICD-10-CM | POA: Diagnosis not present

## 2021-11-15 DIAGNOSIS — M5031 Other cervical disc degeneration,  high cervical region: Secondary | ICD-10-CM | POA: Diagnosis not present

## 2021-11-15 NOTE — Telephone Encounter (Signed)
Received a denial for both mri that were ordered. The reasons for denial listed below.   Basically there has not been six weeks of provider directed treatment and a follow up with the pain being the same or worse.    For [id number] Magnetic Resonance Imaging (MRI) Lumbar Spine without Contrast The request cannot be approved because: Imaging requires six weeks of provider directed treatment to be completed. This must have been completed in the past three months without improved symptoms. Contact (via office visit, phone, email, or messaging) must occur after the treatment is completed. This has not been met because: You have not completed six weeks of provider directed treatment. Symptoms must be the same or worse after treatment to support imaging.   For 73721 Magnetic Resonance Imaging (MRI) we cannot approve this request. Your healthcare provider told us that you have pain in your hip. The request cannot be approved because: Imaging requires six weeks of provider directed treatment to be completed. This must have been completed in the past three months without improved symptoms. Contact (via office visit, phone, email, or messaging) must occur after the treatment is completed. This has not been met because: You have not completed six weeks of provider directed treatment. Symptoms must be the same or worse after treatment to support imaging.  

## 2021-11-15 NOTE — Telephone Encounter (Signed)
Patient called to follow up on next steps.

## 2021-11-18 ENCOUNTER — Ambulatory Visit: Payer: Medicare HMO | Admitting: Family Medicine

## 2021-11-18 VITALS — Ht 62.0 in

## 2021-11-18 DIAGNOSIS — M50322 Other cervical disc degeneration at C5-C6 level: Secondary | ICD-10-CM | POA: Diagnosis not present

## 2021-11-18 DIAGNOSIS — M50323 Other cervical disc degeneration at C6-C7 level: Secondary | ICD-10-CM | POA: Diagnosis not present

## 2021-11-18 DIAGNOSIS — R29898 Other symptoms and signs involving the musculoskeletal system: Secondary | ICD-10-CM | POA: Diagnosis not present

## 2021-11-18 DIAGNOSIS — M9903 Segmental and somatic dysfunction of lumbar region: Secondary | ICD-10-CM | POA: Diagnosis not present

## 2021-11-18 DIAGNOSIS — M9904 Segmental and somatic dysfunction of sacral region: Secondary | ICD-10-CM | POA: Diagnosis not present

## 2021-11-18 DIAGNOSIS — M9905 Segmental and somatic dysfunction of pelvic region: Secondary | ICD-10-CM | POA: Diagnosis not present

## 2021-11-18 DIAGNOSIS — M4312 Spondylolisthesis, cervical region: Secondary | ICD-10-CM | POA: Diagnosis not present

## 2021-11-18 DIAGNOSIS — M5135 Other intervertebral disc degeneration, thoracolumbar region: Secondary | ICD-10-CM | POA: Diagnosis not present

## 2021-11-18 DIAGNOSIS — M5416 Radiculopathy, lumbar region: Secondary | ICD-10-CM

## 2021-11-18 DIAGNOSIS — M9901 Segmental and somatic dysfunction of cervical region: Secondary | ICD-10-CM | POA: Diagnosis not present

## 2021-11-18 DIAGNOSIS — M50321 Other cervical disc degeneration at C4-C5 level: Secondary | ICD-10-CM | POA: Diagnosis not present

## 2021-11-18 DIAGNOSIS — M5031 Other cervical disc degeneration,  high cervical region: Secondary | ICD-10-CM | POA: Diagnosis not present

## 2021-11-18 DIAGNOSIS — M5413 Radiculopathy, cervicothoracic region: Secondary | ICD-10-CM | POA: Diagnosis not present

## 2021-11-18 MED ORDER — PREDNISONE 50 MG PO TABS: 50.0000 mg | ORAL_TABLET | Freq: Every day | ORAL | 0 refills | Status: DC

## 2021-11-18 MED ORDER — KETOROLAC TROMETHAMINE 60 MG/2ML IM SOLN
60.0000 mg | Freq: Once | INTRAMUSCULAR | Status: AC
  Administered 2021-11-18: 60 mg via INTRAMUSCULAR

## 2021-11-18 NOTE — Progress Notes (Signed)
I, Peterson Lombard, LAT, ATC acting as a scribe for [name], MD.  Reonna Finlayson is a 66 y.o. female who presents to [place] at [facility] today for re-check R thigh pain. Pt was last seen by Dr. Georgina Snell on 11/06/21 and was given a R GT steroid injection and was prescribed prednisone and gabapentin. Pt called the office on 10/6 reporting the Gabapentin not helping and not taking the prednisone and was switched to Lyrica and encouraged to take the prednisone. Pt called the office again on 10/9 c/o worsening pain radiating down her leg and was prescribed oxycodone and a R hip and L-spine MRI's were ordered. Pt called the office again on 10/10 w/ an update of her status and requesting a prednisone refill. Today, pt today her pain has worsened exponentially. Pt now locates pain to the R lateral and anterior thigh. Pt notes the Lyrica was slightly helping w/ her pain, but did not provide any relief today.   Most significantly she notes significant weakness in her right leg and has had several falls over the last day or 2.  Numbness: yes- R lower leg Weakness: yes- suffered 3 falls.   Dx imaging: 11/06/21 R hip & L-spine XR  Pertinent review of systems: No fevers or chills.  Positive for new right leg weakness.  Relevant historical information: History of depression.   Exam:  Ht '5\' 2"'$  (1.575 m)   BMI 29.81 kg/m  General: In pain appearing woman crying.  MSK: L-spine nontender midline.  Decreased lumbar motion. Lower extremity strength decreased strength to right hip flexion and right knee extension both 3/5.   Right hip abduction slightly decreased 4/5.   Otherwise lower extremity strength is intact and equal bilaterally. Reflexes diminished right knee extension.  Reflexes intact bilaterally otherwise. Antalgic gait.    Lab and Radiology Results  EXAM: LUMBAR SPINE - 2-3 VIEW   COMPARISON:  None Available.   FINDINGS: There are 5 non-rib-bearing lumbar  vertebra. Normal alignment. Normal vertebral body heights. Disc space narrowing and spurring at L1-L2 and L2-L3. There is facet hypertrophy from L3-L4 through L5-S1. Normal vertebral body heights. No evidence of fracture or focal bone abnormality. The sacroiliac joints are congruent.   IMPRESSION: 1. Mild degenerative disc disease at L1-L2 and L2-L3. 2. Facet hypertrophy from L3-L4 through L5-S1.     Electronically Signed   By: Keith Rake M.D.   On: 11/08/2021 12:32 I, [name], personally (independently) visualized and performed the interpretation of the images attached in this note.     Assessment and Plan: 66 y.o. female with worsening right leg pain thought to be lumbar radiculopathy.  Pain is in a L3 L4 dermatomal pattern which corresponds with her weakness especially to hip flexion and knee extension.\  There may be an L2 component as well. She has had rapidly progressive worsening neurologic symptoms over the last week.  Plan to repeat course of prednisone.  Will give Toradol shot today.  Additionally will increase Lyrica to 150 mg at night and 75 mg during the day.  Most importantly will reorder lumbar spine MRI as stat.  This should be done in the next few days.  Anticipate epidural steroid injection and potentially direct referral to spine surgery following MRI.    PDMP not reviewed this encounter. Orders Placed This Encounter  Procedures   MR Lumbar Spine Wo Contrast    Standing Status:   Future    Standing Expiration Date:   11/19/2022  Order Specific Question:   What is the patient's sedation requirement?    Answer:   No Sedation    Order Specific Question:   Does the patient have a pacemaker or implanted devices?    Answer:   No    Order Specific Question:   Preferred imaging location?    Answer:   [facility] development [facility] (table limit-350lbs)   Meds ordered this encounter  Medications   predniSONE (DELTASONE) 50 MG tablet    Sig: Take 1 tablet (50 mg  total) by mouth daily.    Dispense:  5 tablet    Refill:  0     Discussed warning signs or symptoms. Please see discharge instructions. Patient expresses understanding.   The above documentation has been reviewed and is accurate and complete [name], M.D.

## 2021-11-18 NOTE — Telephone Encounter (Signed)
Pt called back again to see what the next steps are. She's in a lot of pain.

## 2021-11-18 NOTE — Patient Instructions (Addendum)
Thank you for coming in today.   I will re-order the MRI.   Ok to increase the lyrica up to 2 pills 2x daily. Start at 1 pill in the morning and 2 at night.   OK to use oxycodone sparingly.   We should have some answers on MRI soon.   Expect an epidural steroid injection following the MRI.   Get a walker and use it.

## 2021-11-18 NOTE — Addendum Note (Signed)
Addended by: Pollyann Glen on: 11/18/2021 02:27 PM   Modules accepted: Orders

## 2021-11-18 NOTE — Telephone Encounter (Signed)
I called Kayla Mueller back. She is experiencing worsening pain and weakness.  I scheduled her for a visit with me today so that we can repeat a physical exam evaluate weakness and likely get the MRI approved.

## 2021-11-19 ENCOUNTER — Telehealth: Payer: Self-pay

## 2021-11-19 NOTE — Telephone Encounter (Signed)
Called patient to inform her that I was on the phone with her insurance for awhile trying to get the MRI of the lumbar spine approved. We have exhausted all options through [employer] and had to do an URGENT appeal through patients insurance directly. After I had given them all the information I was informed that I needed to fax anything clinical that had to do with the order of the lumbar spine and the appeal. I tried to see if this could be done over the phone as the patient is not doing well to wait much longer for this imaging to be done. We are waiting on this imaging to figure out how to continue treating this patient. This cannot be done over the phone so the clinicals and imaging has been faxed to [phone] as requested for an urgent appeal. I informed the insurance company that this needed to be looked at ASAP and informed patient that as soon as we hear something I will be in contact with her.   Patient was informed that if pain got worse that she needed to go to the ED to get the MRI as the worsening pain with radicular symptoms and rapidly progressive worsening neurologic symptoms over the last week needs to be taken care of especially if getting worse. Patient stated understanding.

## 2021-11-20 ENCOUNTER — Telehealth: Payer: Self-pay

## 2021-11-20 ENCOUNTER — Encounter: Payer: Self-pay | Admitting: Family Medicine

## 2021-11-20 NOTE — Telephone Encounter (Signed)
I called her. We should have Lspine results on Friday and can make some decisions then.

## 2021-11-20 NOTE — Telephone Encounter (Signed)
error 

## 2021-11-20 NOTE — Telephone Encounter (Signed)
Patient called to let us know that she was going to self pay for her MRI's at [facility]. I informed her that even if we end up getting the authorization to go through that it may not cover if she gets it done before and that she should call her insurance to see. Patient just said she was in so much pain and did not want to wait. Patient is getting he lumbar done tomorrow and then going back another day for her hip. I will not be able to get the hip covered, and at this point I am not even sure I can get the lumbar spine to get approved. It was offered previously to go to the ED but it does not seem like that is something she wants to do.   Not sure if Dr. Georgina Snell would want to call patient to talk her into maybe going to the ED to save money and get the MRI there.

## 2021-11-21 ENCOUNTER — Other Ambulatory Visit: Payer: Medicare HMO

## 2021-11-23 ENCOUNTER — Ambulatory Visit (INDEPENDENT_AMBULATORY_CARE_PROVIDER_SITE_OTHER): Payer: Medicare HMO

## 2021-11-23 ENCOUNTER — Telehealth: Payer: Self-pay | Admitting: Family Medicine

## 2021-11-23 DIAGNOSIS — M5126 Other intervertebral disc displacement, lumbar region: Secondary | ICD-10-CM | POA: Diagnosis not present

## 2021-11-23 DIAGNOSIS — M25551 Pain in right hip: Secondary | ICD-10-CM

## 2021-11-23 DIAGNOSIS — M5416 Radiculopathy, lumbar region: Secondary | ICD-10-CM | POA: Diagnosis not present

## 2021-11-23 DIAGNOSIS — M47816 Spondylosis without myelopathy or radiculopathy, lumbar region: Secondary | ICD-10-CM | POA: Diagnosis not present

## 2021-11-23 DIAGNOSIS — R29898 Other symptoms and signs involving the musculoskeletal system: Secondary | ICD-10-CM | POA: Diagnosis not present

## 2021-11-23 NOTE — Progress Notes (Signed)
MRI does show a pinched nerve but not as severe as I was expecting. I have ordered a spine injection already. You can call [phone] to schedule it.  I am also going to work on authorization of the hip MRI if the back injection does not help.

## 2021-11-23 NOTE — Telephone Encounter (Signed)
ESI ordered. 

## 2021-11-24 ENCOUNTER — Other Ambulatory Visit: Payer: Medicare HMO

## 2021-11-25 DIAGNOSIS — E039 Hypothyroidism, unspecified: Secondary | ICD-10-CM | POA: Diagnosis not present

## 2021-11-28 ENCOUNTER — Ambulatory Visit
Admission: RE | Admit: 2021-11-28 | Discharge: 2021-11-28 | Disposition: A | Discharge status: Home / Self Care | Payer: Medicare HMO | Source: Ambulatory Visit | Attending: Family Medicine | Admitting: Family Medicine

## 2021-11-28 ENCOUNTER — Other Ambulatory Visit: Payer: Self-pay | Admitting: Family Medicine

## 2021-11-28 DIAGNOSIS — M5416 Radiculopathy, lumbar region: Secondary | ICD-10-CM

## 2021-11-28 DIAGNOSIS — M4727 Other spondylosis with radiculopathy, lumbosacral region: Secondary | ICD-10-CM | POA: Diagnosis not present

## 2021-11-28 MED ORDER — METHYLPREDNISOLONE ACETATE 40 MG/ML INJ SUSP (RADIOLOG
80.0000 mg | Freq: Once | INTRAMUSCULAR | Status: AC
  Administered 2021-11-28: 80 mg via EPIDURAL

## 2021-11-28 MED ORDER — IOPAMIDOL (ISOVUE-M 200) INJECTION 41%
1.0000 mL | Freq: Once | INTRAMUSCULAR | Status: AC
  Administered 2021-11-28: 1 mL via EPIDURAL

## 2021-11-28 NOTE — Discharge Instructions (Signed)

## 2021-11-30 ENCOUNTER — Encounter: Payer: Self-pay | Admitting: Family Medicine

## 2021-11-30 DIAGNOSIS — M5416 Radiculopathy, lumbar region: Secondary | ICD-10-CM

## 2021-12-03 ENCOUNTER — Telehealth: Payer: Self-pay | Admitting: Family Medicine

## 2021-12-03 NOTE — Telephone Encounter (Signed)
Pt calling with Day 5 update post epidural.  Still having R sided Leg pain, shoots hip to ankle, not as severe as pre epidural.

## 2021-12-04 DIAGNOSIS — J069 Acute upper respiratory infection, unspecified: Secondary | ICD-10-CM | POA: Diagnosis not present

## 2021-12-04 DIAGNOSIS — Z8616 Personal history of COVID-19: Secondary | ICD-10-CM | POA: Diagnosis not present

## 2021-12-04 DIAGNOSIS — Z20822 Contact with and (suspected) exposure to covid-19: Secondary | ICD-10-CM | POA: Diagnosis not present

## 2021-12-04 DIAGNOSIS — G4733 Obstructive sleep apnea (adult) (pediatric): Secondary | ICD-10-CM | POA: Diagnosis not present

## 2021-12-04 DIAGNOSIS — J029 Acute pharyngitis, unspecified: Secondary | ICD-10-CM | POA: Diagnosis not present

## 2021-12-04 NOTE — Telephone Encounter (Signed)
I called [facility] and ordered another [facility]

## 2021-12-10 ENCOUNTER — Telehealth: Payer: Self-pay

## 2021-12-10 NOTE — Telephone Encounter (Signed)
Called patient to see how she was feeling after her epidural and if her follow-up visit scheduled for tomorrow might need pushed back until after the repeat ESI.  Patient reports her pain went from a 11/10 to a 2/10.  Patient was scheduled for a an additional ESI on 11/16.  Patient reports continued weakness in her leg, but some pain relief from the prior ESI.  Patient visit kept and reconfirmed time w/ pt.

## 2021-12-10 NOTE — Progress Notes (Unsigned)
I, Kayla Mueller, LAT, ATC acting as a scribe for Kayla Leader, MD.  Kayla Mueller is a 66 y.o. female who presents to [place] at [facility] today for 1 month follow-up of lumbar radiculitis.  Patient was last seen by Dr. Georgina Snell on 11/18/2021 and a lumbar MRI was reordered and pt was given a Toradol IM injection, prescribed a repeat course of prednisone and increase Lyrica dose to '150mg'$  at bedtime. Based on MRI findings an ESI was ordered and later performed on 10/26. Today, pt reports pain after ESI has gone from a 11/10 to a 2/10. Pt notes cont'd weakness, R leg feels "heavy." Pt has slight pain along the lateral portion of her thigh traveling to her R ankle. Pt feels the most pain laying on her R-side or doing a lot of walking/standing. Pt has numbness along the anterior aspect of her R lower leg.   Dx imaging: 11/23/21 L-spine MRI  11/06/21 L-spine & R hip XR  Pertinent review of systems: No fevers or chills  Relevant historical information: Atherosclerosis   Exam:  BP 112/78   Pulse 84   Ht '5\' 2"'$  (1.575 m)   Wt 162 lb (73.5 kg)   SpO2 97%   BMI 29.63 kg/m  General: Well Developed, well nourished, and in no acute distress.   MSK: L-spine nontender midline normal lumbar motion. Decreased strength right hip flexion.  Intact strength bilateral lower extremity otherwise. Sensation is intact.  Right hip normal-appearing normal motion. Decreased strength to right hip flexion as noted above.     Lab and Radiology Results    EXAM: MRI LUMBAR SPINE WITHOUT CONTRAST   TECHNIQUE: Multiplanar, multisequence MR imaging of the lumbar spine was performed. No intravenous contrast was administered.   COMPARISON:  X-ray 11/06/2021   FINDINGS: Segmentation:  Standard.   Alignment:  Physiologic.   Vertebrae: No acute fracture. No evidence of discitis. There are a few scattered T1/T2 hyperintense intraosseous hemangiomas, most notably within the L5  vertebral body. No suspicious marrow replacing bone lesion. Small superior endplate Schmorl's node at L3.   Conus medullaris and cauda equina: Conus extends to the L1 level. Conus and cauda equina appear normal.   Paraspinal and other soft tissues: Negative.   Disc levels:   T12-L1: Unremarkable.   L1-L2: Minimal annular disc bulge. No foraminal or canal stenosis.   L2-L3: Shallow left foraminal protrusion. No foraminal or canal stenosis.   L3-L4: No significant disc protrusion. Mild bilateral facet arthropathy. No foraminal or canal stenosis.   L4-L5: Mild annular disc bulge. Mild bilateral facet arthropathy. Mild bilateral foraminal stenosis. No canal stenosis.   L5-S1: No disc protrusion. Moderate right and mild left facet arthropathy. No significant foraminal stenosis. No canal stenosis.   IMPRESSION: Mild multilevel degenerative changes of the lumbar spine. Mild bilateral foraminal stenosis at L4-L5. No canal stenosis at any level.     Electronically Signed   By: Davina Poke D.O.   On: 11/23/2021 15:00  EXAM: EPIDURAL/NERVE ROOT   FLUOROSCOPY TIME:  Radiation Exposure Index (as provided by the fluoroscopic device): 1.7 mGy Kerma   PROCEDURE: The procedure, risks, benefits, and alternatives were explained to the patient. Questions regarding the procedure were encouraged and answered. The patient understands and consents to the procedure.   RIGHT L4 NERVE ROOT BLOCK AND TRANSFORAMINAL EPIDURAL: A posterior oblique approach was taken to the intervertebral foramen on the right at L4-L5 using a curved 5 inch 22 gauge spinal needle. Injection of Isovue  M 200 outlined the right L4 nerve root and showed good epidural spread. No vascular opacification is seen. 80 mg of Depo-Medrol mixed with 2 mL of 1% lidocaine were instilled. The procedure was well-tolerated, and the patient was discharged thirty minutes following the injection in good condition.    COMPLICATIONS: None immediate.   IMPRESSION: Technically successful injection consisting of a right L4 nerve root block and transforaminal epidural.     Electronically Signed   By: Titus Dubin M.D.   On: 11/28/2021 14:08     EXAM: EPIDURAL/NERVE ROOT   FLUOROSCOPY TIME:  Radiation Exposure Index (as provided by the fluoroscopic device): 1.7 mGy Kerma   PROCEDURE: The procedure, risks, benefits, and alternatives were explained to the patient. Questions regarding the procedure were encouraged and answered. The patient understands and consents to the procedure.   RIGHT L4 NERVE ROOT BLOCK AND TRANSFORAMINAL EPIDURAL: A posterior oblique approach was taken to the intervertebral foramen on the right at L4-L5 using a curved 5 inch 22 gauge spinal needle. Injection of Isovue M 200 outlined the right L4 nerve root and showed good epidural spread. No vascular opacification is seen. 80 mg of Depo-Medrol mixed with 2 mL of 1% lidocaine were instilled. The procedure was well-tolerated, and the patient was discharged thirty minutes following the injection in good condition.   COMPLICATIONS: None immediate.   IMPRESSION: Technically successful injection consisting of a right L4 nerve root block and transforaminal epidural.     Electronically Signed   By: Titus Dubin M.D.   On: 11/28/2021 14:08      Assessment and Plan: 66 y.o. female with right leg pain.  Pain is predominantly thought to be L4-L5 lumbar radiculopathy.  However she continues to experience some weakness and heaviness in the leg.  The dominant physical exam findings weakness to right hip flexion which does not correspond to nerve root impingement seen on MRI.  Plan for trial of physical therapy in addition to prior taught home exercise program and if not improving will proceed to MRI of the right hip.  Continue with scheduled epidural steroid injection for remaining pain in the leg due to  radiculopathy.  Total encounter time 30 minutes including face-to-face time with the patient and, reviewing past medical record, and charting on the date of service.    PDMP not reviewed this encounter. Orders Placed This Encounter  Procedures   Ambulatory referral to Physical Therapy    Referral Priority:   Routine    Referral Type:   Physical Medicine    Referral Reason:   Specialty Services Required    Requested Specialty:   Physical Therapy    Number of Visits Requested:   1   No orders of the defined types were placed in this encounter.    Discussed warning signs or symptoms. Please see discharge instructions. Patient expresses understanding.   The above documentation has been reviewed and is accurate and complete Kayla Mueller, M.D.

## 2021-12-11 ENCOUNTER — Ambulatory Visit: Payer: Medicare HMO | Admitting: Family Medicine

## 2021-12-11 VITALS — BP 112/78 | HR 84 | Ht 62.0 in | Wt 162.0 lb

## 2021-12-11 DIAGNOSIS — M25551 Pain in right hip: Secondary | ICD-10-CM

## 2021-12-11 DIAGNOSIS — M5416 Radiculopathy, lumbar region: Secondary | ICD-10-CM

## 2021-12-11 DIAGNOSIS — R29898 Other symptoms and signs involving the musculoskeletal system: Secondary | ICD-10-CM | POA: Diagnosis not present

## 2021-12-11 NOTE — Patient Instructions (Addendum)
Thank you for coming in today.   I've referred you to Physical Therapy.  Let us know if you don't hear from them in one week.   Keep me updated.   We can do that HIP MRI whenever.

## 2021-12-13 ENCOUNTER — Telehealth: Payer: Self-pay | Admitting: Family Medicine

## 2021-12-13 NOTE — Telephone Encounter (Signed)
Patient called to let Dr Georgina Snell know that beginning Thursday night she started having very sharp pain in the top part of her right hip that comes and goes. She just wanted Dr Georgina Snell to be aware.

## 2021-12-16 DIAGNOSIS — R262 Difficulty in walking, not elsewhere classified: Secondary | ICD-10-CM | POA: Diagnosis not present

## 2021-12-16 DIAGNOSIS — M6281 Muscle weakness (generalized): Secondary | ICD-10-CM | POA: Diagnosis not present

## 2021-12-16 DIAGNOSIS — M25551 Pain in right hip: Secondary | ICD-10-CM | POA: Diagnosis not present

## 2021-12-16 DIAGNOSIS — M545 Low back pain, unspecified: Secondary | ICD-10-CM | POA: Diagnosis not present

## 2021-12-17 ENCOUNTER — Encounter: Payer: Self-pay | Admitting: Family Medicine

## 2021-12-19 ENCOUNTER — Inpatient Hospital Stay: Admission: RE | Admit: 2021-12-19 | Payer: Medicare HMO | Source: Ambulatory Visit

## 2021-12-23 DIAGNOSIS — M25551 Pain in right hip: Secondary | ICD-10-CM | POA: Diagnosis not present

## 2021-12-23 DIAGNOSIS — M6281 Muscle weakness (generalized): Secondary | ICD-10-CM | POA: Diagnosis not present

## 2021-12-23 DIAGNOSIS — R262 Difficulty in walking, not elsewhere classified: Secondary | ICD-10-CM | POA: Diagnosis not present

## 2021-12-23 DIAGNOSIS — M545 Low back pain, unspecified: Secondary | ICD-10-CM | POA: Diagnosis not present

## 2021-12-23 NOTE — Progress Notes (Unsigned)
   I, Peterson Lombard, LAT, ATC acting as a scribe for [name], MD.  Debbera Wolken is a 66 y.o. female who presents to [place] at [facility] today for continued right foot pain patient's most recent visits with Dr. Georgina Snell list on lumbar radiculopathy. Pt's prior visits focusing on her R foot were on July 2023 and she was given a R 2nd tarsometatarsal steroid injection.  Today, pt reports R foot pain has returned over this past week. Pt locates pain to the dorsum of her R foot.   Dx imaging: 04/19/21 R foot XR  Pertinent review of systems: no fever or chills  Relevant historical information: Lumbar radiculopathy   Exam:  BP (!) 158/88   Pulse 89   Ht '5\' 2"'$  (1.575 m)   Wt 161 lb (73 kg)   SpO2 97%   BMI 29.45 kg/m  General: Well Developed, well nourished, and in no acute distress.   MSK: Right foot normal appearing tender palpation dorsal midfoot.    Lab and Radiology Results  Procedure: Real-time Ultrasound Guided Injection of right second tarsometatarsal joint Device: Philips Affiniti 50G Images permanently stored and available for review in PACS Verbal informed consent obtained.  Discussed risks and benefits of procedure. Warned about infection, bleeding, hyperglycemia damage to structures among others. Patient expresses understanding and agreement Time-out conducted.   Noted no overlying erythema, induration, or other signs of local infection.   Skin prepped in a sterile fashion.   Local anesthesia: Topical Ethyl chloride.   With sterile technique and under real time ultrasound guidance: 40 mg of Kenalog and 1 mL of Marcaine injected into second tarsometatarsal joint. Fluid seen entering the joint capsule.   Completed without difficulty   Pain immediately resolved suggesting accurate placement of the medication.   Advised to call if fevers/chills, erythema, induration, drainage, or persistent bleeding.   Images permanently stored and available for  review in the ultrasound unit.  Impression: Technically successful ultrasound guided injection.        Assessment and Plan: 66 y.o. 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  This is an acute exacerbation of a chronic problem.  Plan for repeat steroid injection.  Last injection was in July.  Recheck back as needed.  Continue midfoot support.   PDMP not reviewed this encounter. Orders Placed This Encounter  Procedures   [place] LIMITED JOINT SPACE STRUCTURES LOW RIGHT(NO LINKED CHARGES)    Order Specific Question:   Reason for Exam (SYMPTOM  OR DIAGNOSIS REQUIRED)    Answer:   rt foot inj    Order Specific Question:   Preferred imaging location?    Answer:   [place]   No orders of the defined types were placed in this encounter.    Discussed warning signs or symptoms. Please see discharge instructions. Patient expresses understanding.   The above documentation has been reviewed and is accurate and complete [name], M.D.

## 2021-12-24 ENCOUNTER — Ambulatory Visit: Payer: Self-pay

## 2021-12-24 ENCOUNTER — Ambulatory Visit: Payer: Medicare HMO | Admitting: Family Medicine

## 2021-12-24 VITALS — BP 158/88 | HR 89 | Ht 62.0 in | Wt 161.0 lb

## 2021-12-24 DIAGNOSIS — M79671 Pain in right foot: Secondary | ICD-10-CM

## 2021-12-24 NOTE — Patient Instructions (Signed)
Thank you for coming in today.   Call or go to the ER if you develop a large red swollen joint with extreme pain or oozing puss.    Recheck as needed.    

## 2021-12-30 DIAGNOSIS — M545 Low back pain, unspecified: Secondary | ICD-10-CM | POA: Diagnosis not present

## 2021-12-30 DIAGNOSIS — R262 Difficulty in walking, not elsewhere classified: Secondary | ICD-10-CM | POA: Diagnosis not present

## 2021-12-30 DIAGNOSIS — M25551 Pain in right hip: Secondary | ICD-10-CM | POA: Diagnosis not present

## 2021-12-30 DIAGNOSIS — M6281 Muscle weakness (generalized): Secondary | ICD-10-CM | POA: Diagnosis not present

## 2022-01-01 DIAGNOSIS — M545 Low back pain, unspecified: Secondary | ICD-10-CM | POA: Diagnosis not present

## 2022-01-01 DIAGNOSIS — M25551 Pain in right hip: Secondary | ICD-10-CM | POA: Diagnosis not present

## 2022-01-01 DIAGNOSIS — R262 Difficulty in walking, not elsewhere classified: Secondary | ICD-10-CM | POA: Diagnosis not present

## 2022-01-01 DIAGNOSIS — M6281 Muscle weakness (generalized): Secondary | ICD-10-CM | POA: Diagnosis not present

## 2022-01-03 DIAGNOSIS — G4733 Obstructive sleep apnea (adult) (pediatric): Secondary | ICD-10-CM | POA: Diagnosis not present

## 2022-01-06 DIAGNOSIS — M6281 Muscle weakness (generalized): Secondary | ICD-10-CM | POA: Diagnosis not present

## 2022-01-06 DIAGNOSIS — M25551 Pain in right hip: Secondary | ICD-10-CM | POA: Diagnosis not present

## 2022-01-06 DIAGNOSIS — R262 Difficulty in walking, not elsewhere classified: Secondary | ICD-10-CM | POA: Diagnosis not present

## 2022-01-06 DIAGNOSIS — M545 Low back pain, unspecified: Secondary | ICD-10-CM | POA: Diagnosis not present

## 2022-01-08 DIAGNOSIS — R262 Difficulty in walking, not elsewhere classified: Secondary | ICD-10-CM | POA: Diagnosis not present

## 2022-01-08 DIAGNOSIS — M25551 Pain in right hip: Secondary | ICD-10-CM | POA: Diagnosis not present

## 2022-01-08 DIAGNOSIS — M6281 Muscle weakness (generalized): Secondary | ICD-10-CM | POA: Diagnosis not present

## 2022-01-08 DIAGNOSIS — M545 Low back pain, unspecified: Secondary | ICD-10-CM | POA: Diagnosis not present

## 2022-01-13 DIAGNOSIS — M545 Low back pain, unspecified: Secondary | ICD-10-CM | POA: Diagnosis not present

## 2022-01-13 DIAGNOSIS — R262 Difficulty in walking, not elsewhere classified: Secondary | ICD-10-CM | POA: Diagnosis not present

## 2022-01-13 DIAGNOSIS — M25551 Pain in right hip: Secondary | ICD-10-CM | POA: Diagnosis not present

## 2022-01-13 DIAGNOSIS — M6281 Muscle weakness (generalized): Secondary | ICD-10-CM | POA: Diagnosis not present

## 2022-01-15 DIAGNOSIS — R262 Difficulty in walking, not elsewhere classified: Secondary | ICD-10-CM | POA: Diagnosis not present

## 2022-01-15 DIAGNOSIS — M545 Low back pain, unspecified: Secondary | ICD-10-CM | POA: Diagnosis not present

## 2022-01-15 DIAGNOSIS — M25551 Pain in right hip: Secondary | ICD-10-CM | POA: Diagnosis not present

## 2022-01-15 DIAGNOSIS — M6281 Muscle weakness (generalized): Secondary | ICD-10-CM | POA: Diagnosis not present

## 2022-01-20 DIAGNOSIS — R262 Difficulty in walking, not elsewhere classified: Secondary | ICD-10-CM | POA: Diagnosis not present

## 2022-01-20 DIAGNOSIS — M545 Low back pain, unspecified: Secondary | ICD-10-CM | POA: Diagnosis not present

## 2022-01-20 DIAGNOSIS — M6281 Muscle weakness (generalized): Secondary | ICD-10-CM | POA: Diagnosis not present

## 2022-01-20 DIAGNOSIS — M25551 Pain in right hip: Secondary | ICD-10-CM | POA: Diagnosis not present

## 2022-01-22 DIAGNOSIS — R262 Difficulty in walking, not elsewhere classified: Secondary | ICD-10-CM | POA: Diagnosis not present

## 2022-01-22 DIAGNOSIS — M545 Low back pain, unspecified: Secondary | ICD-10-CM | POA: Diagnosis not present

## 2022-01-22 DIAGNOSIS — M6281 Muscle weakness (generalized): Secondary | ICD-10-CM | POA: Diagnosis not present

## 2022-01-22 DIAGNOSIS — M25551 Pain in right hip: Secondary | ICD-10-CM | POA: Diagnosis not present

## 2022-01-28 DIAGNOSIS — M545 Low back pain, unspecified: Secondary | ICD-10-CM | POA: Diagnosis not present

## 2022-01-28 DIAGNOSIS — M25551 Pain in right hip: Secondary | ICD-10-CM | POA: Diagnosis not present

## 2022-01-28 DIAGNOSIS — R262 Difficulty in walking, not elsewhere classified: Secondary | ICD-10-CM | POA: Diagnosis not present

## 2022-01-28 DIAGNOSIS — M6281 Muscle weakness (generalized): Secondary | ICD-10-CM | POA: Diagnosis not present

## 2022-01-30 DIAGNOSIS — M6281 Muscle weakness (generalized): Secondary | ICD-10-CM | POA: Diagnosis not present

## 2022-01-30 DIAGNOSIS — M545 Low back pain, unspecified: Secondary | ICD-10-CM | POA: Diagnosis not present

## 2022-01-30 DIAGNOSIS — R262 Difficulty in walking, not elsewhere classified: Secondary | ICD-10-CM | POA: Diagnosis not present

## 2022-01-30 DIAGNOSIS — M25551 Pain in right hip: Secondary | ICD-10-CM | POA: Diagnosis not present

## 2022-02-03 DIAGNOSIS — G4733 Obstructive sleep apnea (adult) (pediatric): Secondary | ICD-10-CM | POA: Diagnosis not present

## 2022-02-04 DIAGNOSIS — R262 Difficulty in walking, not elsewhere classified: Secondary | ICD-10-CM | POA: Diagnosis not present

## 2022-02-04 DIAGNOSIS — M25551 Pain in right hip: Secondary | ICD-10-CM | POA: Diagnosis not present

## 2022-02-04 DIAGNOSIS — M545 Low back pain, unspecified: Secondary | ICD-10-CM | POA: Diagnosis not present

## 2022-02-04 DIAGNOSIS — M6281 Muscle weakness (generalized): Secondary | ICD-10-CM | POA: Diagnosis not present

## 2022-02-06 DIAGNOSIS — M25551 Pain in right hip: Secondary | ICD-10-CM | POA: Diagnosis not present

## 2022-02-06 DIAGNOSIS — M6281 Muscle weakness (generalized): Secondary | ICD-10-CM | POA: Diagnosis not present

## 2022-02-06 DIAGNOSIS — M545 Low back pain, unspecified: Secondary | ICD-10-CM | POA: Diagnosis not present

## 2022-02-06 DIAGNOSIS — R262 Difficulty in walking, not elsewhere classified: Secondary | ICD-10-CM | POA: Diagnosis not present

## 2022-02-10 DIAGNOSIS — Z01419 Encounter for gynecological examination (general) (routine) without abnormal findings: Secondary | ICD-10-CM | POA: Diagnosis not present

## 2022-02-10 DIAGNOSIS — N952 Postmenopausal atrophic vaginitis: Secondary | ICD-10-CM | POA: Diagnosis not present

## 2022-02-10 DIAGNOSIS — Z1231 Encounter for screening mammogram for malignant neoplasm of breast: Secondary | ICD-10-CM | POA: Diagnosis not present

## 2022-02-10 DIAGNOSIS — M6281 Muscle weakness (generalized): Secondary | ICD-10-CM | POA: Diagnosis not present

## 2022-02-10 DIAGNOSIS — R262 Difficulty in walking, not elsewhere classified: Secondary | ICD-10-CM | POA: Diagnosis not present

## 2022-02-10 DIAGNOSIS — Z6829 Body mass index (BMI) 29.0-29.9, adult: Secondary | ICD-10-CM | POA: Diagnosis not present

## 2022-02-10 DIAGNOSIS — M545 Low back pain, unspecified: Secondary | ICD-10-CM | POA: Diagnosis not present

## 2022-02-10 DIAGNOSIS — M25551 Pain in right hip: Secondary | ICD-10-CM | POA: Diagnosis not present

## 2022-02-12 DIAGNOSIS — M6281 Muscle weakness (generalized): Secondary | ICD-10-CM | POA: Diagnosis not present

## 2022-02-12 DIAGNOSIS — M545 Low back pain, unspecified: Secondary | ICD-10-CM | POA: Diagnosis not present

## 2022-02-12 DIAGNOSIS — M25551 Pain in right hip: Secondary | ICD-10-CM | POA: Diagnosis not present

## 2022-02-12 DIAGNOSIS — R262 Difficulty in walking, not elsewhere classified: Secondary | ICD-10-CM | POA: Diagnosis not present

## 2022-02-19 DIAGNOSIS — M545 Low back pain, unspecified: Secondary | ICD-10-CM | POA: Diagnosis not present

## 2022-02-19 DIAGNOSIS — M25551 Pain in right hip: Secondary | ICD-10-CM | POA: Diagnosis not present

## 2022-02-19 DIAGNOSIS — M6281 Muscle weakness (generalized): Secondary | ICD-10-CM | POA: Diagnosis not present

## 2022-02-19 DIAGNOSIS — R262 Difficulty in walking, not elsewhere classified: Secondary | ICD-10-CM | POA: Diagnosis not present

## 2022-02-21 DIAGNOSIS — R262 Difficulty in walking, not elsewhere classified: Secondary | ICD-10-CM | POA: Diagnosis not present

## 2022-02-21 DIAGNOSIS — M25551 Pain in right hip: Secondary | ICD-10-CM | POA: Diagnosis not present

## 2022-02-21 DIAGNOSIS — M6281 Muscle weakness (generalized): Secondary | ICD-10-CM | POA: Diagnosis not present

## 2022-02-21 DIAGNOSIS — M545 Low back pain, unspecified: Secondary | ICD-10-CM | POA: Diagnosis not present

## 2022-02-24 DIAGNOSIS — M545 Low back pain, unspecified: Secondary | ICD-10-CM | POA: Diagnosis not present

## 2022-02-24 DIAGNOSIS — R262 Difficulty in walking, not elsewhere classified: Secondary | ICD-10-CM | POA: Diagnosis not present

## 2022-02-24 DIAGNOSIS — M25551 Pain in right hip: Secondary | ICD-10-CM | POA: Diagnosis not present

## 2022-02-24 DIAGNOSIS — M6281 Muscle weakness (generalized): Secondary | ICD-10-CM | POA: Diagnosis not present

## 2022-02-25 ENCOUNTER — Ambulatory Visit: Payer: Medicare HMO | Admitting: Family Medicine

## 2022-02-25 NOTE — Progress Notes (Deleted)
   I, Peterson Lombard, LAT, ATC acting as a scribe for [name], MD.  Kayla Mueller is a 67 y.o. female who presents to [place] at [facility] today for R foot pain. Pt was last seen by Dr. Georgina Snell on 12/24/21 and was given a R 2nd tarsometatarsal steroid injection and was advised to cont midfoot support. Today, pt reports R foot pain   Pertinent review of systems: ***  Relevant historical information: ***   Exam:  There were no vitals taken for this visit. General: Well Developed, well nourished, and in no acute distress.   MSK: ***    Lab and Radiology Results No results found for this or any previous visit (from the past 72 hour(s)). No results found.     Assessment and Plan: 66 y.o. female with ***   PDMP not reviewed this encounter. No orders of the defined types were placed in this encounter.  No orders of the defined types were placed in this encounter.    Discussed warning signs or symptoms. Please see discharge instructions. Patient expresses understanding.   ***

## 2022-02-26 DIAGNOSIS — M545 Low back pain, unspecified: Secondary | ICD-10-CM | POA: Diagnosis not present

## 2022-02-26 DIAGNOSIS — M6281 Muscle weakness (generalized): Secondary | ICD-10-CM | POA: Diagnosis not present

## 2022-02-26 DIAGNOSIS — R262 Difficulty in walking, not elsewhere classified: Secondary | ICD-10-CM | POA: Diagnosis not present

## 2022-02-26 DIAGNOSIS — M25551 Pain in right hip: Secondary | ICD-10-CM | POA: Diagnosis not present

## 2022-03-06 DIAGNOSIS — G4733 Obstructive sleep apnea (adult) (pediatric): Secondary | ICD-10-CM | POA: Diagnosis not present

## 2022-03-24 NOTE — Progress Notes (Unsigned)
   I, Peterson Lombard, LAT, ATC acting as a scribe for [name], MD. Vaida Burruss is a 67 y.o. female who presents to [place] at [facility] today for R foot pain. Pt was last seen by Dr. Georgina Snell on 12/24/21 and was given a R 2nd tarsometatarsal steroid injection and was advised to cont midfoot support. Today, pt reports R foot pain ***. Pt is also interested in discussing surgical options.   Dx imaging: 04/19/21 R foot XR   Pertinent review of systems: ***  Relevant historical information: ***   Exam:  There were no vitals taken for this visit. General: Well Developed, well nourished, and in no acute distress.   MSK: ***    Lab and Radiology Results No results found for this or any previous visit (from the past 72 hour(s)). No results found.     Assessment and Plan: 67 y.o. female with ***   PDMP not reviewed this encounter. No orders of the defined types were placed in this encounter.  No orders of the defined types were placed in this encounter.    Discussed warning signs or symptoms. Please see discharge instructions. Patient expresses understanding.   ***

## 2022-03-25 ENCOUNTER — Ambulatory Visit (INDEPENDENT_AMBULATORY_CARE_PROVIDER_SITE_OTHER): Payer: Medicare HMO

## 2022-03-25 ENCOUNTER — Ambulatory Visit: Payer: Medicare HMO | Admitting: Family Medicine

## 2022-03-25 ENCOUNTER — Ambulatory Visit: Payer: Self-pay

## 2022-03-25 VITALS — BP 116/78 | HR 82 | Ht 62.0 in | Wt 156.8 lb

## 2022-03-25 DIAGNOSIS — M19012 Primary osteoarthritis, left shoulder: Secondary | ICD-10-CM | POA: Diagnosis not present

## 2022-03-25 DIAGNOSIS — M25511 Pain in right shoulder: Secondary | ICD-10-CM | POA: Diagnosis not present

## 2022-03-25 DIAGNOSIS — M25512 Pain in left shoulder: Secondary | ICD-10-CM

## 2022-03-25 DIAGNOSIS — M79671 Pain in right foot: Secondary | ICD-10-CM | POA: Diagnosis not present

## 2022-03-25 DIAGNOSIS — M19011 Primary osteoarthritis, right shoulder: Secondary | ICD-10-CM

## 2022-03-25 NOTE — Patient Instructions (Addendum)
Thank you for coming in today.   You received an injection today. Seek immediate medical attention if the joint becomes red, extremely painful, or is oozing fluid.  Please get an Xray today before you leave   We can do the foot visit whenever  Check back as needed

## 2022-03-27 NOTE — Progress Notes (Signed)
Right shoulder x-ray shows arthritis.

## 2022-03-27 NOTE — Progress Notes (Signed)
Left shoulder x-ray shows arthritis and a loose body floating around inside the joint.  We could evaluate this more thoroughly with an MRI in the future if we need to.

## 2022-04-04 ENCOUNTER — Ambulatory Visit (HOSPITAL_BASED_OUTPATIENT_CLINIC_OR_DEPARTMENT_OTHER): Payer: Self-pay | Admitting: Orthopaedic Surgery

## 2022-04-04 ENCOUNTER — Ambulatory Visit (INDEPENDENT_AMBULATORY_CARE_PROVIDER_SITE_OTHER): Payer: Medicare HMO | Admitting: Orthopaedic Surgery

## 2022-04-04 ENCOUNTER — Encounter (HOSPITAL_BASED_OUTPATIENT_CLINIC_OR_DEPARTMENT_OTHER): Payer: Self-pay | Admitting: Orthopaedic Surgery

## 2022-04-04 DIAGNOSIS — M19011 Primary osteoarthritis, right shoulder: Secondary | ICD-10-CM

## 2022-04-04 DIAGNOSIS — G4733 Obstructive sleep apnea (adult) (pediatric): Secondary | ICD-10-CM | POA: Diagnosis not present

## 2022-04-04 NOTE — Progress Notes (Addendum)
Chief Complaint: Bilateral shoulder pain     History of Present Illness:    Kayla Mueller is a 67 y.o. female right dominant female presents with bilateral shoulder pain which has been ongoing now for many years.  She states that over the course of essentially the last decade she has been getting injections into both shoulders every 2 to 3 months.  He is now last only 2 months at the longest.  She does enjoy being active but is experiencing pain with any type of overhead activity.  She most recently had injections with Dr. Georgina Snell 2 weeks prior for which she got good relief from the right but minimal relief on the left.  Her range of motion was able to improve although this is again painful and limited.  She has trialed physical therapy without significant relief    Surgical History:   None  PMH/PSH/Family History/Social History/Meds/Allergies:    Past Medical History:  Diagnosis Date   Allergic rhinitis    Breast cancer ([place])    right   Hypothyroid    Past Surgical History:  Procedure Laterality Date   bilateral arthroscopic knee surgery     BREAST LUMPECTOMY Right    about 12 years ago   CESAREAN SECTION     x2   Social History   Socioeconomic History   Marital status: Widowed    Spouse name: Not on file   Number of children: Not on file   Years of education: Not on file   Highest education level: Not on file  Occupational History   Not on file  Tobacco Use   Smoking status: Never   Smokeless tobacco: Never  Substance and Sexual Activity   Alcohol use: Yes    Comment: social   Drug use: Not on file   Sexual activity: Not on file  Other Topics Concern   Not on file  Social History Narrative   Caffeine: 1-2 cups/day   Social Determinants of Health   Financial Resource Strain: Not on file  Food Insecurity: Not on file  Transportation Needs: Not on file  Physical Activity: Not on file  Stress: Not on file  Social  Connections: Not on file   Family History  Problem Relation Age of Onset   Stroke Mother    Stroke Father    Sleep apnea Sister    Neuropathy Half-Sibling    No Known Allergies Current Outpatient Medications  Medication Sig Dispense Refill   aspirin 81 MG EC tablet Take 81 mg by mouth daily.     Calcium Carb-Cholecalciferol (CALCIUM 600+D3 PO) Take 1 tablet by mouth daily.     famotidine (PEPCID) 20 MG tablet Take 20 mg by mouth at bedtime as needed.     fluticasone (FLONASE) 50 MCG/ACT nasal spray Place 2 sprays into both nostrils daily.     levothyroxine (SYNTHROID) 137 MCG tablet TAKE 1 TABLET ONCE DAILY INTHE MORNING ON AN EMPTY    STOMACH     metFORMIN (GLUCOPHAGE-XR) 500 MG 24 hr tablet 500 mg daily with breakfast.     Multiple Vitamin (MULTIVITAMIN ADULT) TABS 1 tablet     Omega 3 1000 MG CAPS Take 1 capsule by mouth daily.     No current facility-administered medications for this visit.   No results found.  Review of Systems:  A ROS was performed including pertinent positives and negatives as documented in the HPI.  Physical Exam :   Constitutional: NAD and appears stated age Neurological: Alert and oriented Psych: Appropriate affect and cooperative There were no vitals taken for this visit.   Comprehensive Musculoskeletal Exam:    Musculoskeletal Exam    Inspection Right Left  Skin No atrophy or winging No atrophy or winging  Palpation    Tenderness Glenohumeral Glenohumeral  Range of Motion    Flexion (passive) 150 150  Flexion (active) 150 150  Abduction 140 140  ER at the side 45 45  Can reach behind back to L3 L3  Strength     4/5 5/5  Special Tests    Pseudoparalytic No No  Neurologic    Fires PIN, radial, median, ulnar, musculocutaneous, axillary, suprascapular, long thoracic, and spinal accessory innervated muscles. No abnormal sensibility  Vascular/Lymphatic    Radial Pulse 2+ 2+  Cervical Exam    Patient has symmetric cervical range of motion  with negative Spurling's test.  Special Test:      Imaging:   Xray (3 view right shoulder, 3 views left shoulder): Moderate glenohumeral osteoarthritis on both sides with a large loose body in the left shoulder  I personally reviewed and interpreted the radiographs.   Assessment:   67 y.o. 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  She is now getting relief only approximately for up to 2 months from injections.  Given the fact that she has trialed this and PT I did discuss shoulder arthroplasty with her.  We did specifically discuss that I do believe she would be candidate for right reverse shoulder arthroplasty. She would be a candidate for anatomic shoulder arthroplasty on the left I discussed the limitations and restrictions along with the associated rehab with this.  At this time she is leaning towards getting this done as her injections are only working for a limited time.  I do believe this would be reasonable.  She will send [place] a message should she want to pursue left shoulderarthroplasty  Plan :    -Plan for left anatomic shoulder arthroplasty     I personally saw and evaluated the patient, and participated in the management and treatment plan.  Vanetta Mulders, MD Attending Physician, Orthopedic Surgery  This document was dictated using Dragon voice recognition software. A reasonable attempt at proof reading has been made to minimize errors.

## 2022-04-15 DIAGNOSIS — R638 Other symptoms and signs concerning food and fluid intake: Secondary | ICD-10-CM | POA: Diagnosis not present

## 2022-04-15 DIAGNOSIS — R7303 Prediabetes: Secondary | ICD-10-CM | POA: Diagnosis not present

## 2022-04-15 DIAGNOSIS — R300 Dysuria: Secondary | ICD-10-CM | POA: Diagnosis not present

## 2022-04-18 ENCOUNTER — Telehealth: Payer: Self-pay | Admitting: Orthopaedic Surgery

## 2022-04-18 ENCOUNTER — Ambulatory Visit (HOSPITAL_BASED_OUTPATIENT_CLINIC_OR_DEPARTMENT_OTHER): Payer: Self-pay | Admitting: Orthopaedic Surgery

## 2022-04-18 ENCOUNTER — Other Ambulatory Visit (HOSPITAL_BASED_OUTPATIENT_CLINIC_OR_DEPARTMENT_OTHER): Payer: Self-pay | Admitting: Orthopaedic Surgery

## 2022-04-18 DIAGNOSIS — M19012 Primary osteoarthritis, left shoulder: Secondary | ICD-10-CM

## 2022-04-18 DIAGNOSIS — M19011 Primary osteoarthritis, right shoulder: Secondary | ICD-10-CM

## 2022-04-18 NOTE — Telephone Encounter (Signed)
Patient decided to go ahead with shoulder surgery. Please call to give further information

## 2022-04-22 ENCOUNTER — Telehealth: Payer: Self-pay | Admitting: Family Medicine

## 2022-04-22 NOTE — Telephone Encounter (Signed)
Pt having shoulder surgery in May or June. Provider advised her not to have any shoulder injections for 3 months prior to surgery.  Pt would like to have her foot injections, but it unsure if she can have these with the impending surgery.

## 2022-04-23 NOTE — Telephone Encounter (Signed)
Message sent to pt through MyChart.  

## 2022-04-23 NOTE — Telephone Encounter (Signed)
Foot injections should be fine.

## 2022-05-01 NOTE — Progress Notes (Signed)
Shirlyn Goltz, PhD, LAT, ATC acting as a scribe for [name], MD.  Kayla Mueller is a 67 y.o. female who presents to [place] at [facility] today for continued bilateral foot pain.  Patient was last seen by Dr. Georgina Snell on 03/25/2022 for this issue as well as bilateral shoulder pain.  Patient was given bilateral [place] steroid injections and was referred to Dr. Sammuel Hines for surgical consultation. Pt was given a R 2nd MT steroid injection on 12/27/21. Today, pt reports her feet have been painful. She notes severity and worse laterality will vary. Pain remains along the 2nd MT.  Dx imaging: 04/19/21 R foot XR   Pertinent review of systems: No fevers or chills  Relevant historical information: Planning bilateral shoulder replacements   Exam:  BP 130/82   Pulse 97   Ht 5\' 2"  (1.575 m)   Wt 160 lb (72.6 kg)   SpO2 97%   BMI 29.26 kg/m  General: Well Developed, well nourished, and in no acute distress.   MSK: Feet bilaterally bossing and some swelling at dorsal midfoot.    Lab and Radiology Results  Procedure: Real-time Ultrasound Guided Injection of right second tarsometatarsal joint Device: Philips Affiniti 50G Images permanently stored and available for review in PACS Verbal informed consent obtained.  Discussed risks and benefits of procedure. Warned about infection, bleeding, hyperglycemia damage to structures among others. Patient expresses understanding and agreement Time-out conducted.   Noted no overlying erythema, induration, or other signs of local infection.   Skin prepped in a sterile fashion.   Local anesthesia: Topical Ethyl chloride.   With sterile technique and under real time ultrasound guidance: 40 mg of Kenalog and 1 mL of lidocaine injected into second tarsometatarsal joint. Fluid seen entering the joint capsule.   Completed without difficulty   Pain immediately resolved suggesting accurate placement of the medication.   Advised to call  if fevers/chills, erythema, induration, drainage, or persistent bleeding.   Images permanently stored and available for review in the ultrasound unit.  Impression: Technically successful ultrasound guided injection.    Procedure: Real-time Ultrasound Guided Injection of left second tarsometatarsal joint Device: Philips Affiniti 50G Images permanently stored and available for review in PACS Verbal informed consent obtained.  Discussed risks and benefits of procedure. Warned about infection, bleeding, hyperglycemia damage to structures among others. Patient expresses understanding and agreement Time-out conducted.   Noted no overlying erythema, induration, or other signs of local infection.   Skin prepped in a sterile fashion.   Local anesthesia: Topical Ethyl chloride.   With sterile technique and under real time ultrasound guidance: 40 mg of Kenalog and 1 mL of lidocaine injected into joint capsule. Fluid seen entering the joint.   Completed without difficulty   Pain immediately resolved suggesting accurate placement of the medication.   Advised to call if fevers/chills, erythema, induration, drainage, or persistent bleeding.   Images permanently stored and available for review in the ultrasound unit.  Impression: Technically successful ultrasound guided injection.        Assessment and Plan: 67 y.o. 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  Plan for bilateral injections at second tarsometatarsal joints.  This has worked well in the past.  Return as needed.   PDMP not reviewed this encounter. Orders Placed This Encounter  Procedures   [place] LIMITED JOINT SPACE STRUCTURES LOW BILAT(NO LINKED CHARGES)    Order Specific Question:   Reason for Exam (SYMPTOM  OR DIAGNOSIS REQUIRED)  Answer:   bilateral foot pain    Order Specific Question:   Preferred imaging location?    Answer:   [facility]   No orders of the defined types were placed in this  encounter.    Discussed warning signs or symptoms. Please see discharge instructions. Patient expresses understanding.   The above documentation has been reviewed and is accurate and complete [name], M.D.

## 2022-05-05 ENCOUNTER — Ambulatory Visit: Payer: Medicare HMO | Admitting: Family Medicine

## 2022-05-05 ENCOUNTER — Other Ambulatory Visit: Payer: Self-pay

## 2022-05-05 VITALS — BP 130/82 | HR 97 | Ht 62.0 in | Wt 160.0 lb

## 2022-05-05 DIAGNOSIS — M79671 Pain in right foot: Secondary | ICD-10-CM | POA: Diagnosis not present

## 2022-05-05 DIAGNOSIS — M79672 Pain in left foot: Secondary | ICD-10-CM | POA: Diagnosis not present

## 2022-05-05 DIAGNOSIS — G4733 Obstructive sleep apnea (adult) (pediatric): Secondary | ICD-10-CM | POA: Diagnosis not present

## 2022-05-05 NOTE — Patient Instructions (Signed)
Thank you for coming in today.   You received an injection today. Seek immediate medical attention if the joint becomes red, extremely painful, or is oozing fluid.   Good luck with your upcoming shoulder surgery.

## 2022-05-20 ENCOUNTER — Ambulatory Visit (HOSPITAL_BASED_OUTPATIENT_CLINIC_OR_DEPARTMENT_OTHER)
Admission: RE | Admit: 2022-05-20 | Discharge: 2022-05-20 | Disposition: A | Discharge status: Home / Self Care | Payer: Medicare HMO | Source: Ambulatory Visit | Attending: Orthopaedic Surgery | Admitting: Orthopaedic Surgery

## 2022-05-20 DIAGNOSIS — M19012 Primary osteoarthritis, left shoulder: Secondary | ICD-10-CM | POA: Insufficient documentation

## 2022-05-20 DIAGNOSIS — Z01818 Encounter for other preprocedural examination: Secondary | ICD-10-CM | POA: Diagnosis not present

## 2022-05-22 DIAGNOSIS — R7303 Prediabetes: Secondary | ICD-10-CM | POA: Diagnosis not present

## 2022-05-22 DIAGNOSIS — I2584 Coronary atherosclerosis due to calcified coronary lesion: Secondary | ICD-10-CM | POA: Diagnosis not present

## 2022-05-27 DIAGNOSIS — F339 Major depressive disorder, recurrent, unspecified: Secondary | ICD-10-CM | POA: Diagnosis not present

## 2022-05-27 DIAGNOSIS — R7303 Prediabetes: Secondary | ICD-10-CM | POA: Diagnosis not present

## 2022-05-27 DIAGNOSIS — I2510 Atherosclerotic heart disease of native coronary artery without angina pectoris: Secondary | ICD-10-CM | POA: Diagnosis not present

## 2022-05-27 DIAGNOSIS — E039 Hypothyroidism, unspecified: Secondary | ICD-10-CM | POA: Diagnosis not present

## 2022-05-27 DIAGNOSIS — M85851 Other specified disorders of bone density and structure, right thigh: Secondary | ICD-10-CM | POA: Diagnosis not present

## 2022-05-27 DIAGNOSIS — K219 Gastro-esophageal reflux disease without esophagitis: Secondary | ICD-10-CM | POA: Diagnosis not present

## 2022-05-27 DIAGNOSIS — Z0000 Encounter for general adult medical examination without abnormal findings: Secondary | ICD-10-CM | POA: Diagnosis not present

## 2022-05-27 DIAGNOSIS — I700 Atherosclerosis of aorta: Secondary | ICD-10-CM | POA: Diagnosis not present

## 2022-05-27 DIAGNOSIS — J309 Allergic rhinitis, unspecified: Secondary | ICD-10-CM | POA: Diagnosis not present

## 2022-05-27 DIAGNOSIS — K2100 Gastro-esophageal reflux disease with esophagitis, without bleeding: Secondary | ICD-10-CM | POA: Diagnosis not present

## 2022-05-27 DIAGNOSIS — G629 Polyneuropathy, unspecified: Secondary | ICD-10-CM | POA: Diagnosis not present

## 2022-05-27 DIAGNOSIS — Z1211 Encounter for screening for malignant neoplasm of colon: Secondary | ICD-10-CM | POA: Diagnosis not present

## 2022-05-29 ENCOUNTER — Telehealth: Payer: Self-pay | Admitting: Orthopaedic Surgery

## 2022-05-29 NOTE — Telephone Encounter (Signed)
Patient asking if Dr. Steward Drone has received the  blood work from her physical for her surgery. Please advise.

## 2022-05-30 ENCOUNTER — Telehealth: Payer: Self-pay | Admitting: Orthopaedic Surgery

## 2022-05-30 NOTE — Telephone Encounter (Signed)
Please advise 

## 2022-05-30 NOTE — Telephone Encounter (Signed)
I spoke with patient today and scheduled her for surgery on 06/17/22. Left total shoulder arthroplasty. Patient would like to know about how long can she expect recovery post surgery to take? Please call to advise.

## 2022-06-02 NOTE — Telephone Encounter (Signed)
Called and advised pt.

## 2022-06-04 DIAGNOSIS — G4733 Obstructive sleep apnea (adult) (pediatric): Secondary | ICD-10-CM | POA: Diagnosis not present

## 2022-06-10 ENCOUNTER — Encounter: Payer: Self-pay | Admitting: Internal Medicine

## 2022-06-11 ENCOUNTER — Other Ambulatory Visit (HOSPITAL_BASED_OUTPATIENT_CLINIC_OR_DEPARTMENT_OTHER): Payer: Self-pay | Admitting: Orthopaedic Surgery

## 2022-06-11 DIAGNOSIS — M19011 Primary osteoarthritis, right shoulder: Secondary | ICD-10-CM

## 2022-06-11 DIAGNOSIS — M19012 Primary osteoarthritis, left shoulder: Secondary | ICD-10-CM

## 2022-06-11 NOTE — Progress Notes (Signed)
Called patient to do pre-op phone call but patient states the total shoulder surgery will be re-scheduled d/t insurance denied covering this procedure at this time.

## 2022-06-17 ENCOUNTER — Ambulatory Visit (HOSPITAL_BASED_OUTPATIENT_CLINIC_OR_DEPARTMENT_OTHER): Admission: RE | Admit: 2022-06-17 | Payer: Medicare HMO | Source: Home / Self Care | Admitting: Orthopaedic Surgery

## 2022-06-17 ENCOUNTER — Encounter (HOSPITAL_BASED_OUTPATIENT_CLINIC_OR_DEPARTMENT_OTHER): Admission: RE | Payer: Self-pay | Source: Home / Self Care

## 2022-06-17 SURGERY — ARTHROPLASTY, SHOULDER, TOTAL
Anesthesia: Regional | Site: Shoulder | Laterality: Left

## 2022-06-20 ENCOUNTER — Ambulatory Visit (HOSPITAL_BASED_OUTPATIENT_CLINIC_OR_DEPARTMENT_OTHER): Payer: Self-pay | Admitting: Physical Therapy

## 2022-06-23 ENCOUNTER — Ambulatory Visit (HOSPITAL_BASED_OUTPATIENT_CLINIC_OR_DEPARTMENT_OTHER): Payer: Medicare HMO | Admitting: Physical Therapy

## 2022-06-24 DIAGNOSIS — M25612 Stiffness of left shoulder, not elsewhere classified: Secondary | ICD-10-CM | POA: Diagnosis not present

## 2022-06-24 DIAGNOSIS — M6281 Muscle weakness (generalized): Secondary | ICD-10-CM | POA: Diagnosis not present

## 2022-06-24 DIAGNOSIS — M25611 Stiffness of right shoulder, not elsewhere classified: Secondary | ICD-10-CM | POA: Diagnosis not present

## 2022-06-24 DIAGNOSIS — M25511 Pain in right shoulder: Secondary | ICD-10-CM | POA: Diagnosis not present

## 2022-06-24 DIAGNOSIS — M25512 Pain in left shoulder: Secondary | ICD-10-CM | POA: Diagnosis not present

## 2022-06-27 ENCOUNTER — Encounter (HOSPITAL_BASED_OUTPATIENT_CLINIC_OR_DEPARTMENT_OTHER): Payer: Medicare HMO | Admitting: Orthopaedic Surgery

## 2022-06-27 DIAGNOSIS — M25611 Stiffness of right shoulder, not elsewhere classified: Secondary | ICD-10-CM | POA: Diagnosis not present

## 2022-06-27 DIAGNOSIS — M25511 Pain in right shoulder: Secondary | ICD-10-CM | POA: Diagnosis not present

## 2022-06-27 DIAGNOSIS — M25612 Stiffness of left shoulder, not elsewhere classified: Secondary | ICD-10-CM | POA: Diagnosis not present

## 2022-06-27 DIAGNOSIS — M6281 Muscle weakness (generalized): Secondary | ICD-10-CM | POA: Diagnosis not present

## 2022-06-27 DIAGNOSIS — M25512 Pain in left shoulder: Secondary | ICD-10-CM | POA: Diagnosis not present

## 2022-06-28 ENCOUNTER — Encounter (HOSPITAL_BASED_OUTPATIENT_CLINIC_OR_DEPARTMENT_OTHER): Payer: Self-pay | Admitting: Rehabilitative and Restorative Service Providers"

## 2022-07-01 DIAGNOSIS — M25511 Pain in right shoulder: Secondary | ICD-10-CM | POA: Diagnosis not present

## 2022-07-01 DIAGNOSIS — M6281 Muscle weakness (generalized): Secondary | ICD-10-CM | POA: Diagnosis not present

## 2022-07-01 DIAGNOSIS — M25611 Stiffness of right shoulder, not elsewhere classified: Secondary | ICD-10-CM | POA: Diagnosis not present

## 2022-07-01 DIAGNOSIS — M25512 Pain in left shoulder: Secondary | ICD-10-CM | POA: Diagnosis not present

## 2022-07-01 DIAGNOSIS — M25612 Stiffness of left shoulder, not elsewhere classified: Secondary | ICD-10-CM | POA: Diagnosis not present

## 2022-07-04 DIAGNOSIS — M25612 Stiffness of left shoulder, not elsewhere classified: Secondary | ICD-10-CM | POA: Diagnosis not present

## 2022-07-04 DIAGNOSIS — M25511 Pain in right shoulder: Secondary | ICD-10-CM | POA: Diagnosis not present

## 2022-07-04 DIAGNOSIS — M25611 Stiffness of right shoulder, not elsewhere classified: Secondary | ICD-10-CM | POA: Diagnosis not present

## 2022-07-04 DIAGNOSIS — M25512 Pain in left shoulder: Secondary | ICD-10-CM | POA: Diagnosis not present

## 2022-07-04 DIAGNOSIS — M6281 Muscle weakness (generalized): Secondary | ICD-10-CM | POA: Diagnosis not present

## 2022-07-05 ENCOUNTER — Encounter (HOSPITAL_BASED_OUTPATIENT_CLINIC_OR_DEPARTMENT_OTHER): Payer: Self-pay | Admitting: Rehabilitative and Restorative Service Providers"

## 2022-07-07 ENCOUNTER — Encounter (HOSPITAL_BASED_OUTPATIENT_CLINIC_OR_DEPARTMENT_OTHER): Payer: Self-pay | Admitting: Physical Therapy

## 2022-07-07 DIAGNOSIS — M25611 Stiffness of right shoulder, not elsewhere classified: Secondary | ICD-10-CM | POA: Diagnosis not present

## 2022-07-07 DIAGNOSIS — M6281 Muscle weakness (generalized): Secondary | ICD-10-CM | POA: Diagnosis not present

## 2022-07-07 DIAGNOSIS — M25512 Pain in left shoulder: Secondary | ICD-10-CM | POA: Diagnosis not present

## 2022-07-07 DIAGNOSIS — M25612 Stiffness of left shoulder, not elsewhere classified: Secondary | ICD-10-CM | POA: Diagnosis not present

## 2022-07-07 DIAGNOSIS — M25511 Pain in right shoulder: Secondary | ICD-10-CM | POA: Diagnosis not present

## 2022-07-11 DIAGNOSIS — M25612 Stiffness of left shoulder, not elsewhere classified: Secondary | ICD-10-CM | POA: Diagnosis not present

## 2022-07-11 DIAGNOSIS — M25511 Pain in right shoulder: Secondary | ICD-10-CM | POA: Diagnosis not present

## 2022-07-11 DIAGNOSIS — M25512 Pain in left shoulder: Secondary | ICD-10-CM | POA: Diagnosis not present

## 2022-07-11 DIAGNOSIS — M6281 Muscle weakness (generalized): Secondary | ICD-10-CM | POA: Diagnosis not present

## 2022-07-11 DIAGNOSIS — M25611 Stiffness of right shoulder, not elsewhere classified: Secondary | ICD-10-CM | POA: Diagnosis not present

## 2022-07-14 ENCOUNTER — Encounter (HOSPITAL_BASED_OUTPATIENT_CLINIC_OR_DEPARTMENT_OTHER): Payer: Self-pay | Admitting: Physical Therapy

## 2022-07-15 DIAGNOSIS — M25612 Stiffness of left shoulder, not elsewhere classified: Secondary | ICD-10-CM | POA: Diagnosis not present

## 2022-07-15 DIAGNOSIS — M6281 Muscle weakness (generalized): Secondary | ICD-10-CM | POA: Diagnosis not present

## 2022-07-15 DIAGNOSIS — M25511 Pain in right shoulder: Secondary | ICD-10-CM | POA: Diagnosis not present

## 2022-07-15 DIAGNOSIS — M25512 Pain in left shoulder: Secondary | ICD-10-CM | POA: Diagnosis not present

## 2022-07-15 DIAGNOSIS — M25611 Stiffness of right shoulder, not elsewhere classified: Secondary | ICD-10-CM | POA: Diagnosis not present

## 2022-07-17 ENCOUNTER — Encounter (HOSPITAL_BASED_OUTPATIENT_CLINIC_OR_DEPARTMENT_OTHER): Payer: Self-pay | Admitting: Physical Therapy

## 2022-07-17 DIAGNOSIS — M25512 Pain in left shoulder: Secondary | ICD-10-CM | POA: Diagnosis not present

## 2022-07-17 DIAGNOSIS — M25612 Stiffness of left shoulder, not elsewhere classified: Secondary | ICD-10-CM | POA: Diagnosis not present

## 2022-07-17 DIAGNOSIS — M6281 Muscle weakness (generalized): Secondary | ICD-10-CM | POA: Diagnosis not present

## 2022-07-17 DIAGNOSIS — M25511 Pain in right shoulder: Secondary | ICD-10-CM | POA: Diagnosis not present

## 2022-07-17 DIAGNOSIS — M25611 Stiffness of right shoulder, not elsewhere classified: Secondary | ICD-10-CM | POA: Diagnosis not present

## 2022-07-21 ENCOUNTER — Encounter (HOSPITAL_BASED_OUTPATIENT_CLINIC_OR_DEPARTMENT_OTHER): Payer: Self-pay

## 2022-07-21 DIAGNOSIS — M25611 Stiffness of right shoulder, not elsewhere classified: Secondary | ICD-10-CM | POA: Diagnosis not present

## 2022-07-21 DIAGNOSIS — M6281 Muscle weakness (generalized): Secondary | ICD-10-CM | POA: Diagnosis not present

## 2022-07-21 DIAGNOSIS — M25512 Pain in left shoulder: Secondary | ICD-10-CM | POA: Diagnosis not present

## 2022-07-21 DIAGNOSIS — M25511 Pain in right shoulder: Secondary | ICD-10-CM | POA: Diagnosis not present

## 2022-07-21 DIAGNOSIS — M25612 Stiffness of left shoulder, not elsewhere classified: Secondary | ICD-10-CM | POA: Diagnosis not present

## 2022-07-24 ENCOUNTER — Encounter (HOSPITAL_BASED_OUTPATIENT_CLINIC_OR_DEPARTMENT_OTHER): Payer: Self-pay

## 2022-07-24 DIAGNOSIS — M25612 Stiffness of left shoulder, not elsewhere classified: Secondary | ICD-10-CM | POA: Diagnosis not present

## 2022-07-24 DIAGNOSIS — M6281 Muscle weakness (generalized): Secondary | ICD-10-CM | POA: Diagnosis not present

## 2022-07-24 DIAGNOSIS — M25511 Pain in right shoulder: Secondary | ICD-10-CM | POA: Diagnosis not present

## 2022-07-24 DIAGNOSIS — M25611 Stiffness of right shoulder, not elsewhere classified: Secondary | ICD-10-CM | POA: Diagnosis not present

## 2022-07-24 DIAGNOSIS — M25512 Pain in left shoulder: Secondary | ICD-10-CM | POA: Diagnosis not present

## 2022-07-28 DIAGNOSIS — M6281 Muscle weakness (generalized): Secondary | ICD-10-CM | POA: Diagnosis not present

## 2022-07-28 DIAGNOSIS — M25611 Stiffness of right shoulder, not elsewhere classified: Secondary | ICD-10-CM | POA: Diagnosis not present

## 2022-07-28 DIAGNOSIS — M25612 Stiffness of left shoulder, not elsewhere classified: Secondary | ICD-10-CM | POA: Diagnosis not present

## 2022-07-28 DIAGNOSIS — M25512 Pain in left shoulder: Secondary | ICD-10-CM | POA: Diagnosis not present

## 2022-07-28 DIAGNOSIS — M25511 Pain in right shoulder: Secondary | ICD-10-CM | POA: Diagnosis not present

## 2022-07-30 ENCOUNTER — Telehealth: Payer: Self-pay | Admitting: Orthopaedic Surgery

## 2022-07-30 ENCOUNTER — Encounter (HOSPITAL_BASED_OUTPATIENT_CLINIC_OR_DEPARTMENT_OTHER): Payer: Self-pay | Admitting: Orthopaedic Surgery

## 2022-07-30 DIAGNOSIS — M25611 Stiffness of right shoulder, not elsewhere classified: Secondary | ICD-10-CM | POA: Diagnosis not present

## 2022-07-30 DIAGNOSIS — M25612 Stiffness of left shoulder, not elsewhere classified: Secondary | ICD-10-CM | POA: Diagnosis not present

## 2022-07-30 DIAGNOSIS — M25512 Pain in left shoulder: Secondary | ICD-10-CM | POA: Diagnosis not present

## 2022-07-30 DIAGNOSIS — M25511 Pain in right shoulder: Secondary | ICD-10-CM | POA: Diagnosis not present

## 2022-07-30 DIAGNOSIS — M6281 Muscle weakness (generalized): Secondary | ICD-10-CM | POA: Diagnosis not present

## 2022-07-30 NOTE — Telephone Encounter (Signed)
Pt called stating she completed physical therapy and would like to move forward with surgery. Pt phone number is [phone].

## 2022-07-30 NOTE — Progress Notes (Signed)
I reviewed the patient's medical record.  She has now failed a total of 6 weeks of physical therapy.  She is still having progressive shoulder pain and as result we will recommend for surgical intervention

## 2022-07-31 DIAGNOSIS — H524 Presbyopia: Secondary | ICD-10-CM | POA: Diagnosis not present

## 2022-08-04 DIAGNOSIS — J029 Acute pharyngitis, unspecified: Secondary | ICD-10-CM | POA: Diagnosis not present

## 2022-08-04 DIAGNOSIS — R519 Headache, unspecified: Secondary | ICD-10-CM | POA: Diagnosis not present

## 2022-08-04 DIAGNOSIS — Z20822 Contact with and (suspected) exposure to covid-19: Secondary | ICD-10-CM | POA: Diagnosis not present

## 2022-08-04 DIAGNOSIS — S40812A Abrasion of left upper arm, initial encounter: Secondary | ICD-10-CM | POA: Diagnosis not present

## 2022-08-05 NOTE — Telephone Encounter (Signed)
I spoke with the patient and rescheduled her surgery for 09/08/22 at [facility].

## 2022-08-06 ENCOUNTER — Ambulatory Visit: Payer: Medicare HMO | Admitting: Family Medicine

## 2022-08-06 NOTE — Progress Notes (Deleted)
   Rubin Payor, PhD, LAT, ATC acting as a scribe for Clementeen Graham, MD.  Kayla Mueller is a 67 y.o. female who presents to [facility] today for cont'd R foot pain. Pt was last seen by Dr. Denyse Amass on 05/05/22 and was given a bilat 2nd TMT joint steroid injections.  Today, pt reports ***  Dx imaging: 04/19/21 R foot XR   Pertinent review of systems: ***  Relevant historical information: ***   Exam:  There were no vitals taken for this visit. General: Well Developed, well nourished, and in no acute distress.   MSK: ***    Lab and Radiology Results No results found for this or any previous visit (from the past 72 hour(s)). No results found.     Assessment and Plan: 67 y.o. female with ***   PDMP not reviewed this encounter. No orders of the defined types were placed in this encounter.  No orders of the defined types were placed in this encounter.    Discussed warning signs or symptoms. Please see discharge instructions. Patient expresses understanding.   ***

## 2022-08-13 DIAGNOSIS — D1801 Hemangioma of skin and subcutaneous tissue: Secondary | ICD-10-CM | POA: Diagnosis not present

## 2022-08-13 DIAGNOSIS — L821 Other seborrheic keratosis: Secondary | ICD-10-CM | POA: Diagnosis not present

## 2022-08-13 DIAGNOSIS — L918 Other hypertrophic disorders of the skin: Secondary | ICD-10-CM | POA: Diagnosis not present

## 2022-08-13 DIAGNOSIS — L700 Acne vulgaris: Secondary | ICD-10-CM | POA: Diagnosis not present

## 2022-08-13 DIAGNOSIS — L814 Other melanin hyperpigmentation: Secondary | ICD-10-CM | POA: Diagnosis not present

## 2022-08-14 ENCOUNTER — Other Ambulatory Visit: Payer: Self-pay

## 2022-08-14 ENCOUNTER — Telehealth: Payer: Self-pay | Admitting: Orthopaedic Surgery

## 2022-08-14 ENCOUNTER — Encounter: Payer: Self-pay | Admitting: Family Medicine

## 2022-08-14 ENCOUNTER — Ambulatory Visit: Payer: Medicare HMO | Admitting: Family Medicine

## 2022-08-14 VITALS — BP 122/82 | HR 90 | Ht 62.0 in | Wt 175.0 lb

## 2022-08-14 DIAGNOSIS — M25561 Pain in right knee: Secondary | ICD-10-CM

## 2022-08-14 DIAGNOSIS — G8929 Other chronic pain: Secondary | ICD-10-CM | POA: Diagnosis not present

## 2022-08-14 DIAGNOSIS — M79671 Pain in right foot: Secondary | ICD-10-CM | POA: Diagnosis not present

## 2022-08-14 NOTE — Telephone Encounter (Signed)
The patient is scheduled for a TSA on 09/08/22. She has an appointment today at 12:45 pm with foot doctor (?) to get cortisone injection in her foot. Patient wants to make sure she can get a cortisone injection in her foot and knee (if doctor) will do both, and she will still be okay for shoulder surgery on 09/08/22. Please call to advise.

## 2022-08-14 NOTE — Patient Instructions (Signed)
Thank you for coming in today.   You received an injection today. Seek immediate medical attention if the joint becomes red, extremely painful, or is oozing fluid.  

## 2022-08-14 NOTE — Progress Notes (Signed)
I, Kayla Mueller, CMA acting as a scribe for Kayla Graham, MD.  Klani Mueller is a 67 y.o. female who presents to [facility] at [facility] today for cont'd R foot pain. Pt was last seen by Dr. Denyse Mueller on 05/05/22 and was given a bilat 2nd TMT joint steroid injections.  Today, pt reports worsening foot sx over the past 3 weeks. Mild swelling present. The area is tender to touch. Also s/o right knee pain. Locates pain to medial aspect of the knee. Intermittent swelling. The knee will give out with increased ambulation, no falls since last visit. Has tried Tylenol and IBU along with rest for foot and knee sx.   Dx imaging: 04/19/21 R foot XR   Pertinent review of systems: No fevers or chills  Relevant historical information: Scheduled for shoulder replacement in the near future.  History of breast cancer.   Exam:  BP 122/82   Pulse 90   Ht 5\' 2"  (1.575 m)   Wt 175 lb (79.4 kg)   SpO2 97%   BMI 32.01 kg/m  General: Well Developed, well nourished, and in no acute distress.   MSK: Right foot: Slight skin hyperpigmentation dorsal foot.  Otherwise normal. Tender palpation dorsal midfoot.  Right knee: Mild effusion otherwise normal. Normal motion with crepitation.  Tender palpation medial joint line.    Lab and Radiology Results  Procedure: Real-time Ultrasound Guided Injection of right dorsal midfoot first ray Device: Philips Affiniti 50G/GE Logiq Images permanently stored and available for review in PACS Verbal informed consent obtained.  Discussed risks and benefits of procedure. Warned about infection, bleeding, hyperglycemia damage to structures among others. Patient expresses understanding and agreement Time-out conducted.   Noted no overlying erythema, induration, or other signs of local infection.   Skin prepped in a sterile fashion.   Local anesthesia: Topical Ethyl chloride.   With sterile technique and under real time ultrasound guidance: 40 mg of  Kenalog and 1 mL of lidocaine injected into dorsal midfoot. Fluid seen entering the tarsometatarsal joint.   Completed without difficulty   Pain immediately resolved suggesting accurate placement of the medication.   Advised to call if fevers/chills, erythema, induration, drainage, or persistent bleeding.   Images permanently stored and available for review in the ultrasound unit.  Impression: Technically successful ultrasound guided injection.    Procedure: Real-time Ultrasound Guided Injection of right knee joint superior lateral patella space Device: Philips Affiniti 50G/GE Logiq Images permanently stored and available for review in PACS Verbal informed consent obtained.  Discussed risks and benefits of procedure. Warned about infection, bleeding, hyperglycemia damage to structures among others. Patient expresses understanding and agreement Time-out conducted.   Noted no overlying erythema, induration, or other signs of local infection.   Skin prepped in a sterile fashion.   Local anesthesia: Topical Ethyl chloride.   With sterile technique and under real time ultrasound guidance: 40 mg of Kenalog and 2 mL of Marcaine injected into knee joint. Fluid seen entering the joint capsule.   Completed without difficulty   Pain immediately resolved suggesting accurate placement of the medication.   Advised to call if fevers/chills, erythema, induration, drainage, or persistent bleeding.   Images permanently stored and available for review in the ultrasound unit.  Impression: Technically successful ultrasound guided injection.        Assessment and Plan: 67 y.o. female with right dorsal midfoot pain due to exacerbation of DJD.  Plan for steroid injection today.  It has been about 3 months  since the last injection.  She is thinking about having surgery for this issue next year.  Right knee pain thought to be due to DJD.  Plan for steroid injection.  Plan for shoulder placement with Dr.  Steward Drone   PDMP not reviewed this encounter. Orders Placed This Encounter  Procedures   [place] LIMITED JOINT SPACE STRUCTURES LOW RIGHT(NO LINKED CHARGES)    Order Specific Question:   Reason for Exam (SYMPTOM  OR DIAGNOSIS REQUIRED)    Answer:   right foot pain    Order Specific Question:   Preferred imaging location?    Answer:   [facility]   No orders of the defined types were placed in this encounter.    Discussed warning signs or symptoms. Please see discharge instructions. Patient expresses understanding.   The above documentation has been reviewed and is accurate and complete Kayla Mueller, M.D.

## 2022-08-14 NOTE — Telephone Encounter (Signed)
Spoke with [place], [place] who stated as long as the injection is not in the shoulder, It is okay.  Called and advised patient

## 2022-08-21 ENCOUNTER — Telehealth: Payer: Self-pay

## 2022-08-21 ENCOUNTER — Telehealth: Payer: Self-pay | Admitting: Orthopaedic Surgery

## 2022-08-21 NOTE — Telephone Encounter (Signed)
Erroneous encounter

## 2022-08-21 NOTE — Telephone Encounter (Signed)
Patient left a message stating she is scheduled to go to her Kayla Mueller doctor 09/05/22 for an Endoscopy and rectal exam. Her shoulder surgery is scheduled for 8/5 and patient wants to make sure this will not cause any complications with her shoulder surgery. Please advise.

## 2022-08-22 ENCOUNTER — Other Ambulatory Visit (HOSPITAL_BASED_OUTPATIENT_CLINIC_OR_DEPARTMENT_OTHER): Payer: Self-pay | Admitting: Orthopaedic Surgery

## 2022-08-22 NOTE — Telephone Encounter (Signed)
Returned call to patient and informed her she should space an endoscopy and RSA at least two weeks apart per Dr. Steward Drone. I offered to reschedule her shoulder surgery and patient was adamant she wanted to pursue her RSA and she will reschedule her endoscopy.  Patient also asked if her CPAP machine will affect anything. I informed her to tell the preadmit and anesthesiology staff to ensure no complications.  Kayla Mueller is also scheduled to begin a new diabetes medication 2 weeks post op, I informed her to check with her PCP about the restrictions with that new medication.  Patient also asked how long she will be required to be in a sling post op and if she should sleep in a recliner. I informed her she will be in a sling for 2 weeks post op and then will be required to remain in the sling for sleeping 4 weeks after.

## 2022-08-25 ENCOUNTER — Telehealth: Payer: Self-pay | Admitting: Orthopaedic Surgery

## 2022-08-25 ENCOUNTER — Other Ambulatory Visit: Payer: Self-pay

## 2022-08-25 DIAGNOSIS — M19012 Primary osteoarthritis, left shoulder: Secondary | ICD-10-CM

## 2022-08-25 NOTE — Telephone Encounter (Signed)
Patient called. The PT referral needs to be sent to [facility] in [place].

## 2022-08-26 NOTE — Progress Notes (Unsigned)
[facility] [address] [place]. [place] [address] [phone]       OFFICE FOLLOW UP NOTE  Ms. Kayla Mueller Date of Birth:  1955/03/06 Medical Record Number:  [id number]    Primary neurologist: Dr. Frances Furbish Reason for visit: OSA on CPAP, neuropathy    SUBJECTIVE:   CHIEF COMPLAINT:  No chief complaint on file.  Follow-up visit:  Prior visit: 08/26/2021 with Dr. Frances Furbish   Brief HPI:   Kayla Mueller is a 67 y.o. female who was initially seen by Dr. Frances Furbish on 04/03/2021 for concern of underlying sleep apnea.  HST 05/13/2021 showed moderate OSA with total AHI 19.5/h.  CPAP set up date 06/03/2021.  She returned to office 05/20/2021 with Dr. Frances Furbish for concern of neuropathy with 1+ year history of intermittent numbness and tingling affecting both feet as well as pain and burning sensation.  Some benefit with use of gabapentin.  Reported recent lab work by PCP (unable to view via epic) and consideration of EMG/NCV later if needed.  At prior visit, compliance report showed satisfactory usage and optimal residual AHI.  Gabapentin dosage increased by PCP but complained of increased sleepiness and advise further discussing with PCP.  Did report completion of additional blood work and was told she was prediabetic.  She also complained of forgetfulness felt to possibly be related to medication use, underlying mood disorder and prior untreated sleep apnea.    Interval history:  Compliance report as below showing excellent usage and optimal residual AHI. High leak rate ***  Neuropathy ***      PERTINENT IMAGING      ROS:   14 system review of systems performed and negative with exception of ***  PMH:  Past Medical History:  Diagnosis Date   Allergic rhinitis    Aortic atherosclerosis (HCC)    Atherosclerotic heart disease of native coronary artery without angina pectoris    Breast cancer (HCC)    right   Depression    GERD (gastroesophageal reflux  disease)    Hypothyroid    Osteopenia    right hip    PSH:  Past Surgical History:  Procedure Laterality Date   bilateral arthroscopic knee surgery     BREAST LUMPECTOMY Right    about 12 years ago   CESAREAN SECTION     x2    Social History:  Social History   Socioeconomic History   Marital status: Widowed    Spouse name: Not on file   Number of children: Not on file   Years of education: Not on file   Highest education level: Not on file  Occupational History   Not on file  Tobacco Use   Smoking status: Never   Smokeless tobacco: Never  Substance and Sexual Activity   Alcohol use: Yes    Comment: social   Drug use: Never   Sexual activity: Not on file  Other Topics Concern   Not on file  Social History Narrative   Caffeine: 1-2 cups/day   Social Determinants of Health   Financial Resource Strain: Low Risk  (12/22/2017)   Received from [employer] ([facility] ([facility]   Overall Financial Resource Strain (CARDIA)    Difficulty of Paying Living Expenses: Not hard at all  Food Insecurity: No Food Insecurity (12/22/2017)   Received from [employer] [facility]   Hunger Vital Sign    Worried About Running Out of Food in the Last Year: Never true  Ran Out of Food in the Last Year: Never true  Transportation Needs: No Transportation Needs (12/22/2017)   Received from [employer] [facility]   [employer] - Transportation    Lack of Transportation (Medical): No    Lack of Transportation (Non-Medical): No  Physical Activity: Insufficiently Active (12/22/2017)   Received from [employer] [facility]   Exercise Vital Sign    Days of Exercise per Week: 2 days    Minutes of Exercise per Session: 30 min  Stress: Stress Concern Present (12/22/2017)   Received from [employer] ([facility] ([facility]   [employer] of Occupational [facility] - Occupational Stress Questionnaire    Feeling of Stress : To some extent  Social Connections: Unknown (06/18/2021)   Received from [facility]   Social Network    Social Network: Not on file  Intimate Partner Violence: Unknown (05/10/2021)   Received from [facility]   HITS    Physically Hurt: Not on file    Insult or Talk Down To: Not on file    Threaten Physical Harm: Not on file    Scream or Curse: Not on file    Family History:  Family History  Problem Relation Age of Onset   Stroke Mother    Stroke Father    Sleep apnea Sister    Neuropathy Half-Sibling     Medications:   Current Outpatient Medications on File Prior to Visit  Medication Sig Dispense Refill   acetaminophen (TYLENOL) 500 MG tablet Take 1,000 mg by mouth every 6 (six) hours as needed for moderate pain.     aspirin 81 MG EC tablet Take 81 mg by mouth daily.     b complex vitamins capsule Take 1 capsule by mouth once a week.     calcium carbonate (TUMS - DOSED IN MG ELEMENTAL CALCIUM) 500 MG chewable tablet Chew 1 tablet by mouth daily as needed for indigestion or heartburn.     CALCIUM-MAGNESIUM-VITAMIN D PO Take 1 tablet by mouth once a week.     Cholecalciferol (VITAMIN D) 50 MCG (2000 UT) tablet Take 2,000 Units by mouth once a week.     fluticasone (FLONASE) 50 MCG/ACT nasal spray Place 1 spray into both nostrils daily as needed for allergies.     ibuprofen (ADVIL) 200 MG tablet Take 400 mg by mouth every 6 (six) hours as needed for moderate pain.     levothyroxine (SYNTHROID) 137 MCG tablet TAKE 1 TABLET ONCE DAILY INTHE MORNING ON AN EMPTY    STOMACH     Misc Natural Products (JOINT HEALTH) CAPS Take 1 capsule by mouth once a week.     Multiple Vitamin (MULTIVITAMIN) capsule Take 1 capsule by mouth once a week.     Omega 3 1200 MG CAPS Take 1,200 mg by mouth once a week.     OVER THE COUNTER MEDICATION Take 1 capsule by mouth at  bedtime as needed (sleep). Pure ZZZs sleep aid     OVER THE COUNTER MEDICATION Apply 1 Application topically daily as needed (pain). Hemp Freeze topical cream     rosuvastatin (CRESTOR) 20 MG tablet Take 20 mg by mouth daily.     Turmeric 500 MG CAPS Take 500 mg by mouth once a week.     No current facility-administered medications on file prior to visit.    Allergies:  No Known Allergies    OBJECTIVE:  Physical Exam  There  were no vitals filed for this visit. There is no height or weight on file to calculate BMI. No results found.      No data to display           General: well developed, well nourished, seated, in no evident distress Head: head normocephalic and atraumatic.   Neck: supple with no carotid or supraclavicular bruits Cardiovascular: regular rate and rhythm, no murmurs Musculoskeletal: no deformity Skin:  no rash/petichiae Vascular:  Normal pulses all extremities   Neurologic Exam Mental Status: Awake and fully alert. Oriented to place and time. Recent and remote memory intact. Attention span, concentration and fund of knowledge appropriate. Mood and affect appropriate.  Cranial Nerves: Fundoscopic exam reveals sharp disc margins. Pupils equal, briskly reactive to light. Extraocular movements full without nystagmus. Visual fields full to confrontation. Hearing intact. Facial sensation intact. Face, tongue, palate moves normally and symmetrically.  Motor: Normal bulk and tone. Normal strength in all tested extremity muscles Sensory.: intact to touch , pinprick , position and vibratory sensation.  Coordination: Rapid alternating movements normal in all extremities. Finger-to-nose and heel-to-shin performed accurately bilaterally. Gait and Station: Arises from chair without difficulty. Stance is normal. Gait demonstrates normal stride length and balance with ***. Tandem walk and heel toe ***.  Reflexes: 1+ and symmetric. Toes downgoing.     NIHSS   *** Modified Rankin  ***      ASSESSMENT: Kayla Mueller is a 67 y.o. year old female ***. Vascular risk factors include ***.      PLAN:  *** :  Residual deficit: ***.  Continue {anticoagluation:28091} and {statin:28096} for secondary stroke prevention.   Discussed secondary stroke prevention measures and importance of close PCP follow up for aggressive stroke risk factor management including BP goal<130/90, HLD with LDL goal<70 and DM with A1c.<7 .  Stroke labs ***: LDL ***, A1c *** I have gone over the pathophysiology of stroke, warning signs and symptoms, risk factors and their management in some detail with instructions to go to the closest emergency room for symptoms of concern.     Follow up in *** or call earlier if needed   CC:  [facility] provider: Dr. Pearlean Brownie PCP: Merri Brunette, MD    I spent *** minutes of face-to-face and non-face-to-face time with patient.  This included previsit chart review including review of recent hospitalization, lab review, study review, order entry, electronic health record documentation, patient education regarding recent stroke including etiology, secondary stroke prevention measures and importance of managing stroke risk factors, residual deficits and typical recovery time and answered all other questions to patient satisfaction   Ihor Austin, AGNP-BC  [facility] [address] [place], [place] [address]  Phone [phone] Fax [phone] Note: This document was prepared with digital dictation and possible smart phrase technology. Any transcriptional errors that result from this process are unintentional.

## 2022-08-27 ENCOUNTER — Encounter: Payer: Self-pay | Admitting: Adult Health

## 2022-08-27 ENCOUNTER — Ambulatory Visit: Payer: Medicare HMO | Admitting: Adult Health

## 2022-08-27 VITALS — BP 125/80 | HR 75 | Ht 62.0 in | Wt 171.6 lb

## 2022-08-27 DIAGNOSIS — G4733 Obstructive sleep apnea (adult) (pediatric): Secondary | ICD-10-CM

## 2022-08-27 NOTE — Patient Instructions (Signed)
Your Plan:  Continue nightly use of CPAP for sleep apnea management  Continue to follow with [facility] for any needed supplies or CPAP related concerns     Follow up in 1 year or call earlier if needed     Thank you for coming to see [place] at [facility]. I hope we have been able to provide you high quality care today.  You may receive a patient satisfaction survey over the next few weeks. We would appreciate your feedback and comments so that we may continue to improve ourselves and the health of our patients.

## 2022-08-29 ENCOUNTER — Ambulatory Visit (HOSPITAL_BASED_OUTPATIENT_CLINIC_OR_DEPARTMENT_OTHER): Payer: Self-pay | Admitting: Orthopaedic Surgery

## 2022-08-29 ENCOUNTER — Ambulatory Visit: Payer: Medicare HMO | Admitting: Internal Medicine

## 2022-08-29 DIAGNOSIS — M19012 Primary osteoarthritis, left shoulder: Secondary | ICD-10-CM

## 2022-08-29 NOTE — Progress Notes (Signed)
Surgical Instructions    Your procedure is scheduled on Monday September 08, 2022.  Report to [facility] Main Entrance "A" at 10:15 A.M., then check in with the Admitting office.  Call this number if you have problems the morning of surgery:  [phone]   If you have any questions prior to your surgery date call [phone]: Open Monday-Friday 8am-4pm If you experience any cold or flu symptoms such as cough, fever, chills, shortness of breath, etc. between now and your scheduled surgery, please notify us at the above number     Remember:  Do not eat after midnight the night before your surgery.  You may drink clear liquids until 9:15 the morning of your surgery.   Clear liquids allowed are: Water, Non-Citrus Juices (without pulp), Carbonated Beverages, Clear Tea, Black Coffee ONLY (NO MILK, CREAM OR POWDERED CREAMER of any kind), and Gatorade    Take these medicines the morning of surgery with A SIP OF WATER:  levothyroxine (SYNTHROID)   If needed:  acetaminophen (TYLENOL)  fluticasone (FLONASE)   Follow your surgeon's instructions on when to stop Aspirin.  If no instructions were given by your surgeon then you will need to call the office to get those instructions.    As of today, STOP taking any Aspirin (unless otherwise instructed by your surgeon) Aleve, Naproxen, Ibuprofen, Motrin, Advil, Goody's, BC's, all herbal medications, fish oil, and all vitamins.  Special instructions:    Oral Hygiene is also important to reduce your risk of infection.  Remember - BRUSH YOUR TEETH THE MORNING OF SURGERY WITH YOUR REGULAR TOOTHPASTE    Pre-operative 5 CHG Bath Instructions   You can play a key role in reducing the risk of infection after surgery. Your skin needs to be as free of germs as possible. You can reduce the number of germs on your skin by washing with CHG (chlorhexidine gluconate) soap before surgery. CHG is an antiseptic soap that kills germs and continues to kill germs even  after washing.   DO NOT use if you have an allergy to chlorhexidine/CHG or antibacterial soaps. If your skin becomes reddened or irritated, stop using the CHG and notify one of our RNs at [phone].   Please shower with the CHG soap starting 4 days before surgery using the following schedule:     Please keep in mind the following:  DO NOT shave, including legs and underarms, starting the day of your first shower.   You may shave your face at any point before/day of surgery.  Place clean sheets on your bed the day you start using CHG soap. Use a clean washcloth (not used since being washed) for each shower. DO NOT sleep with pets once you start using the CHG.   CHG Shower Instructions:  If you choose to wash your hair and private area, wash first with your normal shampoo/soap.  After you use shampoo/soap, rinse your hair and body thoroughly to remove shampoo/soap residue.  Turn the water OFF and apply about 3 tablespoons (45 ml) of CHG soap to a CLEAN washcloth.  Apply CHG soap ONLY FROM YOUR NECK DOWN TO YOUR TOES (washing for 3-5 minutes)  DO NOT use CHG soap on face, private areas, open wounds, or sores.  Pay special attention to the area where your surgery is being performed.  If you are having back surgery, having someone wash your back for you may be helpful. Wait 2 minutes after CHG soap is applied, then you may rinse off the  CHG soap.  Pat dry with a clean towel  Put on clean clothes/pajamas   If you choose to wear lotion, please use ONLY the CHG-compatible lotions on the back of this paper.      [place]- Preparing for Total Shoulder Arthroplasty  Before surgery, you can play an important role. Because skin is not sterile, your skin needs to be as free of germs as possible. You can reduce the number of germs on your skin by using the following products.   Benzoyl Peroxide Gel  o Reduces the number of germs present on the skin  o Applied twice a day to shoulder area  starting two days before surgery   Chlorhexidine Gluconate (CHG) Soap (instructions listed above on how to wash with CHG Soap)  o An antiseptic cleaner that kills germs and bonds with the skin to continue killing germs even after washing  o Used for showering the night before surgery and morning of surgery   ==================================================================  Please follow these instructions carefully:  BENZOYL PEROXIDE 5% GEL  Please do not use if you have an allergy to benzoyl peroxide. If your skin becomes reddened/irritated stop using the benzoyl peroxide.  Starting two days before surgery, apply as follows:  1. Apply benzoyl peroxide in the morning and at night. Apply after taking a shower. If you are not taking a shower clean entire shoulder front, back, and side along with the armpit with a clean wet washcloth.  2. Place a quarter-sized dollop on your SHOULDER and rub in thoroughly, making sure to cover the front, back, and side of your shoulder, along with the armpit.   2 Days prior to Surgery First Dose on 09/06/2022 Morning Second Dose on 09/06/2022 Night  Day Before Surgery First Dose on 09/07/2022 Morning Night before surgery wash (entire body except face and private areas) with CHG Soap THEN Second Dose on 09/07/2022 Night   Morning of Surgery  wash BODY AGAIN with CHG Soap   4. Do NOT apply benzoyl peroxide gel on the day of surgery  Day of Surgery: Take a shower with CHG soap. Wear Clean/Comfortable clothing the morning of surgery Do not apply any deodorants/lotions.   Remember to brush your teeth WITH YOUR REGULAR TOOTHPASTE.   Please read over the following fact sheets that you were given.           CHG Compatible Lotions   Aveeno Moisturizing lotion  Cetaphil Moisturizing Cream  Cetaphil Moisturizing Lotion  Clairol Herbal Essence Moisturizing Lotion, Dry Skin  Clairol Herbal Essence Moisturizing Lotion, Extra Dry Skin   Clairol Herbal Essence Moisturizing Lotion, Normal Skin  Curel Age Defying Therapeutic Moisturizing Lotion with Alpha Hydroxy  Curel Extreme Care Body Lotion  Curel Soothing Hands Moisturizing Hand Lotion  Curel Therapeutic Moisturizing Cream, Fragrance-Free  Curel Therapeutic Moisturizing Lotion, Fragrance-Free  Curel Therapeutic Moisturizing Lotion, Original Formula  Eucerin Daily Replenishing Lotion  Eucerin Dry Skin Therapy Plus Alpha Hydroxy Crme  Eucerin Dry Skin Therapy Plus Alpha Hydroxy Lotion  Eucerin Original Crme  Eucerin Original Lotion  Eucerin Plus Crme Eucerin Plus Lotion  Eucerin TriLipid Replenishing Lotion  Keri Anti-Bacterial Hand Lotion  Keri Deep Conditioning Original Lotion Dry Skin Formula Softly Scented  Keri Deep Conditioning Original Lotion, Fragrance Free Sensitive Skin Formula  Keri Lotion Fast Absorbing Fragrance Free Sensitive Skin Formula  Keri Lotion Fast Absorbing Softly Scented Dry Skin Formula  Keri Original Lotion  Keri Skin Renewal Lotion Keri Silky Smooth Lotion  Keri Silky Smooth Sensitive  Skin Lotion  Nivea Body Creamy Conditioning Oil  Nivea Body Extra Enriched Lotion  Nivea Body Original Lotion  Nivea Body Sheer Moisturizing Lotion Nivea Crme  Nivea Skin Firming Lotion  NutraDerm 30 Skin Lotion  NutraDerm Skin Lotion  NutraDerm Therapeutic Skin Cream  NutraDerm Therapeutic Skin Lotion  ProShield Protective Hand Cream  Provon moisturizing lotion   Day of Surgery:  Take a shower with CHG soap. Wear Clean/Comfortable clothing the morning of surgery Do not apply any deodorants/lotions.   Remember to brush your teeth WITH YOUR REGULAR TOOTHPASTE.  [facility] is not responsible for any belongings or valuables.    Do NOT Smoke (Tobacco/Vaping)  24 hours prior to your procedure  If you use a CPAP at night, you may bring your mask for your overnight stay.   Contacts, glasses, hearing aids, dentures or partials may not be worn into  surgery, please bring cases for these belongings   For patients admitted to the hospital, discharge time will be determined by your treatment team.   Patients discharged the day of surgery will not be allowed to drive home, and someone needs to stay with them for 24 hours.   SURGICAL WAITING ROOM VISITATION Patients having surgery or a procedure may have no more than 2 support people in the waiting area - these visitors may rotate.   Children under the age of 51 must have an adult with them who is not the patient. If the patient needs to stay at the hospital during part of their recovery, the visitor guidelines for inpatient rooms apply. Pre-op nurse will coordinate an appropriate time for 1 support person to accompany patient in pre-op.  This support person may not rotate.   Please refer to [email or web address] for the visitor guidelines for Inpatients (after your surgery is over and you are in a regular room).   If you received a COVID test during your pre-op visit, it is requested that you wear a mask when out in public, stay away from anyone that may not be feeling well, and notify your surgeon if you develop symptoms. If you have been in contact with anyone that has tested positive in the last 10 days, please notify your surgeon.    Please read over the following fact sheets that you were given.

## 2022-09-01 ENCOUNTER — Encounter (HOSPITAL_BASED_OUTPATIENT_CLINIC_OR_DEPARTMENT_OTHER): Payer: Self-pay | Admitting: Orthopaedic Surgery

## 2022-09-01 ENCOUNTER — Other Ambulatory Visit: Payer: Self-pay

## 2022-09-01 ENCOUNTER — Inpatient Hospital Stay (HOSPITAL_COMMUNITY)
Admission: RE | Admit: 2022-09-01 | Discharge: 2022-09-01 | Disposition: A | Discharge status: Home / Self Care | Payer: Medicare HMO | Source: Ambulatory Visit

## 2022-09-01 NOTE — Progress Notes (Signed)
   09/01/22 1415  Pre-op Phone Call  Surgery Date Verified 09/08/22  Arrival Time Verified 1015  Surgery Location Verified [facility] [place]  Medical History Reviewed Yes  Is the patient taking a GLP-1 receptor agonist? No  Does the patient have diabetes? No diagnosis of diabetes  Do you have a history of heart problems? No (no cardiologist, primary MD manages care)  Antiarrhythmic device type  (NA)  Does patient have other implanted devices? No  Patient Teaching Enhanced Recovery  Patient educated about smoking cessation 24 hours prior to surgery. N/A Non-Smoker  Med Rec Completed Yes  Take the Following Meds the Morning of Surgery synthroid AM of sx with sip water; hold ASA (pt states her MD told her to do this and she has already stopped); hold MVI/herb/sup x5d  Recent  Lab Work, EKG, CXR? No  NPO (Including gum & candy) After midnight  Patient instructed to stop clear liquids including Carb loading drink at: 0815  Responsible adult to drive and be with you for 24 hours? Yes  Name & Phone Number for Ride/Caregiver daughter, Brandy Hale, money, nail polish or make-up.  No lotions, powders, perfumes. No shaving  48 hrs. prior to surgery. Yes  Contacts, Dentures & Glasses Will Have to be Removed Before OR. Yes  Please bring your ID and Insurance Card the morning of your surgery. (Surgery Centers Only) Yes  Bring any papers or x-rays with you that your surgeon gave you. Yes  Instructed to contact the location of procedure/ provider if they or anyone in their household develops symptoms or tests positive for COVID-19, has close contact with someone who tests positive for COVID, or has known exposure to any contagious illness. Yes  Call this number the morning of surgery  with any problems that may cancel your surgery. [phone]  Covid-19 Assessment  Have you had a positive COVID-19 test within the previous 90 days? No  COVID Testing Guidance Proceed with the additional questions.   Patient's surgery required a COVID-19 test (cardiothoracic, complex ENT, and bronchoscopies/ EBUS) No  Have you been unmasked and in close contact with anyone with COVID-19 or COVID-19 symptoms within the past 10 days? No  Do you or anyone in your household currently have any COVID-19 symptoms? No

## 2022-09-03 ENCOUNTER — Telehealth: Payer: Self-pay | Admitting: Orthopedic Surgery

## 2022-09-03 DIAGNOSIS — M19012 Primary osteoarthritis, left shoulder: Secondary | ICD-10-CM | POA: Diagnosis not present

## 2022-09-03 NOTE — Telephone Encounter (Signed)
Kayla Mueller called in this morning to cancel her shoulder surgery.  She was scheduled for Monday 09/08/22.  She states that her shoulder is much better since she has been doing her "exercises".  Debbie, will you please cancel with [facility], she has a pre-admissions appt tomorrow?Marland Kitchen

## 2022-09-08 ENCOUNTER — Ambulatory Visit (HOSPITAL_COMMUNITY): Admission: RE | Admit: 2022-09-08 | Payer: Medicare HMO | Source: Home / Self Care | Admitting: Orthopaedic Surgery

## 2022-09-08 DIAGNOSIS — Z419 Encounter for procedure for purposes other than remedying health state, unspecified: Secondary | ICD-10-CM

## 2022-09-08 HISTORY — DX: Sleep apnea, unspecified: G47.30

## 2022-09-08 SURGERY — ARTHROPLASTY, SHOULDER, TOTAL
Anesthesia: Regional | Site: Shoulder | Laterality: Left

## 2022-09-12 DIAGNOSIS — M2550 Pain in unspecified joint: Secondary | ICD-10-CM | POA: Diagnosis not present

## 2022-09-12 DIAGNOSIS — M79671 Pain in right foot: Secondary | ICD-10-CM | POA: Diagnosis not present

## 2022-09-12 DIAGNOSIS — N760 Acute vaginitis: Secondary | ICD-10-CM | POA: Diagnosis not present

## 2022-09-18 DIAGNOSIS — M25572 Pain in left ankle and joints of left foot: Secondary | ICD-10-CM | POA: Diagnosis not present

## 2022-09-18 DIAGNOSIS — M1990 Unspecified osteoarthritis, unspecified site: Secondary | ICD-10-CM | POA: Diagnosis not present

## 2022-09-18 DIAGNOSIS — M25562 Pain in left knee: Secondary | ICD-10-CM | POA: Diagnosis not present

## 2022-09-18 DIAGNOSIS — R7303 Prediabetes: Secondary | ICD-10-CM | POA: Diagnosis not present

## 2022-09-18 DIAGNOSIS — M25512 Pain in left shoulder: Secondary | ICD-10-CM | POA: Diagnosis not present

## 2022-09-18 DIAGNOSIS — M79642 Pain in left hand: Secondary | ICD-10-CM | POA: Diagnosis not present

## 2022-09-18 DIAGNOSIS — M79671 Pain in right foot: Secondary | ICD-10-CM | POA: Diagnosis not present

## 2022-09-18 DIAGNOSIS — M25561 Pain in right knee: Secondary | ICD-10-CM | POA: Diagnosis not present

## 2022-09-18 DIAGNOSIS — H04129 Dry eye syndrome of unspecified lacrimal gland: Secondary | ICD-10-CM | POA: Diagnosis not present

## 2022-09-18 DIAGNOSIS — R682 Dry mouth, unspecified: Secondary | ICD-10-CM | POA: Diagnosis not present

## 2022-09-18 DIAGNOSIS — M79672 Pain in left foot: Secondary | ICD-10-CM | POA: Diagnosis not present

## 2022-09-18 DIAGNOSIS — M791 Myalgia, unspecified site: Secondary | ICD-10-CM | POA: Diagnosis not present

## 2022-09-18 DIAGNOSIS — M79641 Pain in right hand: Secondary | ICD-10-CM | POA: Diagnosis not present

## 2022-09-18 DIAGNOSIS — M353 Polymyalgia rheumatica: Secondary | ICD-10-CM | POA: Diagnosis not present

## 2022-09-18 DIAGNOSIS — M25511 Pain in right shoulder: Secondary | ICD-10-CM | POA: Diagnosis not present

## 2022-09-18 DIAGNOSIS — K219 Gastro-esophageal reflux disease without esophagitis: Secondary | ICD-10-CM | POA: Diagnosis not present

## 2022-09-18 DIAGNOSIS — M25571 Pain in right ankle and joints of right foot: Secondary | ICD-10-CM | POA: Diagnosis not present

## 2022-09-22 ENCOUNTER — Encounter (HOSPITAL_BASED_OUTPATIENT_CLINIC_OR_DEPARTMENT_OTHER): Payer: Medicare HMO | Admitting: Orthopaedic Surgery

## 2022-09-26 DIAGNOSIS — R7303 Prediabetes: Secondary | ICD-10-CM | POA: Diagnosis not present

## 2022-10-01 DIAGNOSIS — I83819 Varicose veins of unspecified lower extremities with pain: Secondary | ICD-10-CM | POA: Diagnosis not present

## 2022-10-01 DIAGNOSIS — I83899 Varicose veins of unspecified lower extremities with other complications: Secondary | ICD-10-CM | POA: Diagnosis not present

## 2022-10-01 DIAGNOSIS — I781 Nevus, non-neoplastic: Secondary | ICD-10-CM | POA: Diagnosis not present

## 2022-10-01 DIAGNOSIS — Z1330 Encounter for screening examination for mental health and behavioral disorders, unspecified: Secondary | ICD-10-CM | POA: Diagnosis not present

## 2022-10-02 DIAGNOSIS — M1990 Unspecified osteoarthritis, unspecified site: Secondary | ICD-10-CM | POA: Diagnosis not present

## 2022-10-02 DIAGNOSIS — M5030 Other cervical disc degeneration, unspecified cervical region: Secondary | ICD-10-CM | POA: Diagnosis not present

## 2022-10-02 DIAGNOSIS — R7303 Prediabetes: Secondary | ICD-10-CM | POA: Diagnosis not present

## 2022-10-02 DIAGNOSIS — M791 Myalgia, unspecified site: Secondary | ICD-10-CM | POA: Diagnosis not present

## 2022-10-02 DIAGNOSIS — K219 Gastro-esophageal reflux disease without esophagitis: Secondary | ICD-10-CM | POA: Diagnosis not present

## 2022-10-02 DIAGNOSIS — M353 Polymyalgia rheumatica: Secondary | ICD-10-CM | POA: Diagnosis not present

## 2022-10-08 DIAGNOSIS — E669 Obesity, unspecified: Secondary | ICD-10-CM | POA: Diagnosis not present

## 2022-10-08 DIAGNOSIS — I700 Atherosclerosis of aorta: Secondary | ICD-10-CM | POA: Diagnosis not present

## 2022-10-08 DIAGNOSIS — R7303 Prediabetes: Secondary | ICD-10-CM | POA: Diagnosis not present

## 2022-10-15 DIAGNOSIS — I781 Nevus, non-neoplastic: Secondary | ICD-10-CM | POA: Diagnosis not present

## 2022-10-15 DIAGNOSIS — I83893 Varicose veins of bilateral lower extremities with other complications: Secondary | ICD-10-CM | POA: Diagnosis not present

## 2022-10-17 DIAGNOSIS — M25571 Pain in right ankle and joints of right foot: Secondary | ICD-10-CM | POA: Diagnosis not present

## 2022-10-17 DIAGNOSIS — M79671 Pain in right foot: Secondary | ICD-10-CM | POA: Diagnosis not present

## 2022-10-20 DIAGNOSIS — M19071 Primary osteoarthritis, right ankle and foot: Secondary | ICD-10-CM | POA: Diagnosis not present

## 2022-10-22 DIAGNOSIS — M5030 Other cervical disc degeneration, unspecified cervical region: Secondary | ICD-10-CM | POA: Diagnosis not present

## 2022-10-22 DIAGNOSIS — M1990 Unspecified osteoarthritis, unspecified site: Secondary | ICD-10-CM | POA: Diagnosis not present

## 2022-10-22 DIAGNOSIS — R7303 Prediabetes: Secondary | ICD-10-CM | POA: Diagnosis not present

## 2022-10-22 DIAGNOSIS — K219 Gastro-esophageal reflux disease without esophagitis: Secondary | ICD-10-CM | POA: Diagnosis not present

## 2022-10-22 DIAGNOSIS — M791 Myalgia, unspecified site: Secondary | ICD-10-CM | POA: Diagnosis not present

## 2022-10-22 DIAGNOSIS — M353 Polymyalgia rheumatica: Secondary | ICD-10-CM | POA: Diagnosis not present

## 2022-10-23 DIAGNOSIS — R5382 Chronic fatigue, unspecified: Secondary | ICD-10-CM | POA: Diagnosis not present

## 2022-10-23 DIAGNOSIS — E063 Autoimmune thyroiditis: Secondary | ICD-10-CM | POA: Diagnosis not present

## 2022-10-23 DIAGNOSIS — E782 Mixed hyperlipidemia: Secondary | ICD-10-CM | POA: Diagnosis not present

## 2022-10-23 DIAGNOSIS — E559 Vitamin D deficiency, unspecified: Secondary | ICD-10-CM | POA: Diagnosis not present

## 2022-10-23 DIAGNOSIS — R7303 Prediabetes: Secondary | ICD-10-CM | POA: Diagnosis not present

## 2022-10-23 DIAGNOSIS — K219 Gastro-esophageal reflux disease without esophagitis: Secondary | ICD-10-CM | POA: Diagnosis not present

## 2022-10-23 DIAGNOSIS — G4733 Obstructive sleep apnea (adult) (pediatric): Secondary | ICD-10-CM | POA: Diagnosis not present

## 2022-10-23 DIAGNOSIS — Z6830 Body mass index (BMI) 30.0-30.9, adult: Secondary | ICD-10-CM | POA: Diagnosis not present

## 2022-10-23 DIAGNOSIS — N951 Menopausal and female climacteric states: Secondary | ICD-10-CM | POA: Diagnosis not present

## 2022-10-27 DIAGNOSIS — E039 Hypothyroidism, unspecified: Secondary | ICD-10-CM | POA: Diagnosis not present

## 2022-10-27 DIAGNOSIS — G4733 Obstructive sleep apnea (adult) (pediatric): Secondary | ICD-10-CM | POA: Diagnosis not present

## 2022-10-27 DIAGNOSIS — Z1331 Encounter for screening for depression: Secondary | ICD-10-CM | POA: Diagnosis not present

## 2022-10-27 DIAGNOSIS — R5382 Chronic fatigue, unspecified: Secondary | ICD-10-CM | POA: Diagnosis not present

## 2022-10-27 DIAGNOSIS — K219 Gastro-esophageal reflux disease without esophagitis: Secondary | ICD-10-CM | POA: Diagnosis not present

## 2022-10-27 DIAGNOSIS — N951 Menopausal and female climacteric states: Secondary | ICD-10-CM | POA: Diagnosis not present

## 2022-10-27 DIAGNOSIS — Z1339 Encounter for screening examination for other mental health and behavioral disorders: Secondary | ICD-10-CM | POA: Diagnosis not present

## 2022-10-27 DIAGNOSIS — R7303 Prediabetes: Secondary | ICD-10-CM | POA: Diagnosis not present

## 2022-10-27 DIAGNOSIS — E782 Mixed hyperlipidemia: Secondary | ICD-10-CM | POA: Diagnosis not present

## 2022-10-27 DIAGNOSIS — R635 Abnormal weight gain: Secondary | ICD-10-CM | POA: Diagnosis not present

## 2022-10-27 DIAGNOSIS — E669 Obesity, unspecified: Secondary | ICD-10-CM | POA: Diagnosis not present

## 2022-11-11 DIAGNOSIS — E039 Hypothyroidism, unspecified: Secondary | ICD-10-CM | POA: Diagnosis not present

## 2022-11-11 DIAGNOSIS — K219 Gastro-esophageal reflux disease without esophagitis: Secondary | ICD-10-CM | POA: Diagnosis not present

## 2022-11-11 DIAGNOSIS — Z6831 Body mass index (BMI) 31.0-31.9, adult: Secondary | ICD-10-CM | POA: Diagnosis not present

## 2022-11-11 DIAGNOSIS — M5030 Other cervical disc degeneration, unspecified cervical region: Secondary | ICD-10-CM | POA: Diagnosis not present

## 2022-11-11 DIAGNOSIS — R7303 Prediabetes: Secondary | ICD-10-CM | POA: Diagnosis not present

## 2022-11-11 DIAGNOSIS — M791 Myalgia, unspecified site: Secondary | ICD-10-CM | POA: Diagnosis not present

## 2022-11-11 DIAGNOSIS — M353 Polymyalgia rheumatica: Secondary | ICD-10-CM | POA: Diagnosis not present

## 2022-11-11 DIAGNOSIS — M1990 Unspecified osteoarthritis, unspecified site: Secondary | ICD-10-CM | POA: Diagnosis not present

## 2022-11-12 DIAGNOSIS — M19071 Primary osteoarthritis, right ankle and foot: Secondary | ICD-10-CM | POA: Diagnosis not present

## 2022-11-12 DIAGNOSIS — M79671 Pain in right foot: Secondary | ICD-10-CM | POA: Diagnosis not present

## 2022-11-18 DIAGNOSIS — R7303 Prediabetes: Secondary | ICD-10-CM | POA: Diagnosis not present

## 2022-11-18 DIAGNOSIS — Z6830 Body mass index (BMI) 30.0-30.9, adult: Secondary | ICD-10-CM | POA: Diagnosis not present

## 2022-11-19 NOTE — Progress Notes (Signed)
   Rubin Payor, PhD, LAT, ATC acting as a scribe for Clementeen Graham, MD.  Kayla Mueller is a 67 y.o. female who presents to [facility] today for ***she was scheduled to undergo a left total shoulder replacement on August 5, but it was canceled.   Pertinent review of systems: ***  Relevant historical information: ***   Exam:  There were no vitals taken for this visit. General: Well Developed, well nourished, and in no acute distress.   MSK: ***    Lab and Radiology Results No results found for this or any previous visit (from the past 72 hour(s)). No results found.     Assessment and Plan: 67 y.o. female with ***   PDMP not reviewed this encounter. No orders of the defined types were placed in this encounter.  No orders of the defined types were placed in this encounter.    Discussed warning signs or symptoms. Please see discharge instructions. Patient expresses understanding.   ***

## 2022-11-20 ENCOUNTER — Telehealth: Payer: Self-pay

## 2022-11-20 ENCOUNTER — Ambulatory Visit: Payer: Medicare HMO | Admitting: Family Medicine

## 2022-11-20 ENCOUNTER — Other Ambulatory Visit: Payer: Self-pay

## 2022-11-20 ENCOUNTER — Ambulatory Visit: Payer: Medicare HMO

## 2022-11-20 ENCOUNTER — Encounter: Payer: Self-pay | Admitting: Family Medicine

## 2022-11-20 VITALS — BP 136/84 | HR 98 | Ht 62.0 in | Wt 169.0 lb

## 2022-11-20 DIAGNOSIS — M7732 Calcaneal spur, left foot: Secondary | ICD-10-CM | POA: Diagnosis not present

## 2022-11-20 DIAGNOSIS — G4733 Obstructive sleep apnea (adult) (pediatric): Secondary | ICD-10-CM | POA: Insufficient documentation

## 2022-11-20 DIAGNOSIS — M79672 Pain in left foot: Secondary | ICD-10-CM | POA: Diagnosis not present

## 2022-11-20 DIAGNOSIS — M25512 Pain in left shoulder: Secondary | ICD-10-CM | POA: Diagnosis not present

## 2022-11-20 DIAGNOSIS — G8929 Other chronic pain: Secondary | ICD-10-CM

## 2022-11-20 DIAGNOSIS — G894 Chronic pain syndrome: Secondary | ICD-10-CM | POA: Diagnosis not present

## 2022-11-20 DIAGNOSIS — M19072 Primary osteoarthritis, left ankle and foot: Secondary | ICD-10-CM | POA: Diagnosis not present

## 2022-11-20 DIAGNOSIS — M25511 Pain in right shoulder: Secondary | ICD-10-CM | POA: Diagnosis not present

## 2022-11-20 MED ORDER — DULOXETINE HCL 20 MG PO CPEP: 20.0000 mg | ORAL_CAPSULE | Freq: Every day | ORAL | 1 refills | Status: DC

## 2022-11-20 NOTE — Telephone Encounter (Signed)
Pt called this afternoon reporting severe pain in both of her shoulder after her steroid injections earlier today. She has tried heat and a warm shower, w/ no benefit. No signs of infection. Tearful affect. She declined an opioid-like rx be prescribed. I advised that she would try cold and take some Tylenol. She will keep [place] posted if she would like rx sent in.

## 2022-11-20 NOTE — Patient Instructions (Addendum)
Thank you for coming in today.  Please get an Xray today before you leave  You received an injection today. Seek immediate medical attention if the joint becomes red, extremely painful, or is oozing fluid.  Start Cymbalta 20 mg once daily, your prescription has been sent to [facility] in [place].   The Pain Management Workbook: Powerful CBT and Mindfulness Skills to Take Control of Pain and Reclaim Your Life ThisMLS.nl  Recheck in 1 week.

## 2022-11-25 ENCOUNTER — Encounter: Payer: Self-pay | Admitting: Internal Medicine

## 2022-11-25 ENCOUNTER — Ambulatory Visit: Payer: Medicare HMO | Admitting: Internal Medicine

## 2022-11-25 VITALS — BP 140/80 | HR 80 | Ht 62.0 in | Wt 167.0 lb

## 2022-11-25 DIAGNOSIS — R1310 Dysphagia, unspecified: Secondary | ICD-10-CM

## 2022-11-25 DIAGNOSIS — K219 Gastro-esophageal reflux disease without esophagitis: Secondary | ICD-10-CM

## 2022-11-25 DIAGNOSIS — Z1211 Encounter for screening for malignant neoplasm of colon: Secondary | ICD-10-CM | POA: Diagnosis not present

## 2022-11-25 MED ORDER — PANTOPRAZOLE SODIUM 40 MG PO TBEC: 40.0000 mg | DELAYED_RELEASE_TABLET | Freq: Every day | ORAL | 3 refills | Status: AC

## 2022-11-25 MED ORDER — PLENVU 140 G PO SOLR: 1.0000 | Freq: Once | ORAL | 0 refills | Status: AC

## 2022-11-25 NOTE — Progress Notes (Signed)
HISTORY OF PRESENT ILLNESS:  Kayla Mueller is a 67 y.o. female with past medical history as listed below.  She is sent today by her primary care provider regarding the need for colonoscopy and chronic GERD with dysphagia requesting upper endoscopy.  She is a native of [place].  Relocated to [place] about 3 years ago after the passing of her husband, to be with her daughter and grandchildren.  Patient tells me that she underwent colonoscopy in [place] about 10 or 11 years ago.  She recalls this being unremarkable with a recommendation for follow-up colonoscopy in 10 years.  No family history of colon cancer.  No lower GI complaints such as change in bowel habits or rectal bleeding.  She tells me that she has had severe daily reflux for some time.  Describes pyrosis and regurgitation.  She does have dysphagia to pills (sounds like pill aversion) but otherwise swallows well.  She has not had prior upper endoscopy.  She takes famotidine daily.  Despite this, significant active reflux symptoms.  She sounds miserable with her reflux.  Review of outside evaluation with her PCP from April 2024.  Included our laboratories.  Normal comprehensive metabolic panel.  Normal CBC with hemoglobin 13.7.  She is on semaglutide.  REVIEW OF SYSTEMS:  All non-GI ROS negative unless otherwise stated in the HPI except for arthritis, excessive urination  Past Medical History:  Diagnosis Date   Allergic rhinitis    Aortic atherosclerosis (HCC)    Atherosclerotic heart disease of native coronary artery without angina pectoris    Breast cancer (HCC)    right   Depression    GERD (gastroesophageal reflux disease)    Hypothyroid    Osteopenia    right hip   Sleep apnea    uses CPAP nightly    Past Surgical History:  Procedure Laterality Date   bilateral arthroscopic knee surgery     BREAST LUMPECTOMY Right    about 12 years ago   CESAREAN SECTION     x2    Social History Kayla Mueller   reports that she has never smoked. She has never used smokeless tobacco. She reports current alcohol use. She reports that she does not use drugs.  family history includes Neuropathy in her half-sibling; Sleep apnea in her sister; Stroke in her father and mother.  No Known Allergies     PHYSICAL EXAMINATION: Vital signs: BP (!) 140/80   Pulse 80   Ht 5\' 2"  (1.575 m)   Wt 167 lb (75.8 kg)   BMI 30.54 kg/m   Constitutional: generally well-appearing, no acute distress Psychiatric: alert and oriented x3, cooperative Eyes: extraocular movements intact, anicteric, conjunctiva pink Mouth: oral pharynx moist, no lesions Neck: supple no lymphadenopathy Cardiovascular: heart regular rate and rhythm, no murmur Lungs: clear to auscultation bilaterally Abdomen: soft, nontender, nondistended, no obvious ascites, no peritoneal signs, normal bowel sounds, no organomegaly Rectal: Deferred until colonoscopy Extremities: no clubbing, cyanosis, or lower extremity edema bilaterally Skin: no lesions on visible extremities Neuro: No focal deficits.  Cranial nerves intact  ASSESSMENT:  1.  Longstanding chronic GERD.  Active symptoms despite daily H2 receptor antagonist therapy.  Rule out Barrett's. 2.  Pill dysphagia.  Sounds like pill aversion.  Needs evaluated. 3.  Colon cancer screening.  Sounds like negative colonoscopy 10 or 11 years ago in [place].  No active symptoms.  Normal hemoglobin   PLAN:  1.  Reflux precautions 2.  Prescribe pantoprazole 40 mg daily; #30; multiple refills.  Medication effects and side effects reviewed 3.  Stop famotidine 4.  Schedule upper endoscopy to rule out Barrett's esophagus and evaluate dysphagia. 5.  Schedule colonoscopy for the purposes of screening. 6.  Hold semaglutide at least 1 week prior to the procedures.  She understood and agreed. 7.  Ongoing general medical care with Dr. Renne Crigler A total time of 60 minutes was spent preparing to see the patient,  reviewing outside records, obtaining comprehensive history, performing medically appropriate physical exam, counseling and educating the patient regarding the above listed issues, ordering medication, ordering multiple endoscopic procedures, and documenting clinical information in the health record

## 2022-11-25 NOTE — Patient Instructions (Signed)
You have been scheduled for an endoscopy and colonoscopy. Please follow the written instructions given to you at your visit today.  Please pick up your prep supplies at the pharmacy within the next 1-3 days.  If you use inhalers (even only as needed), please bring them with you on the day of your procedure.  DO NOT TAKE 7 DAYS PRIOR TO TEST- Trulicity (dulaglutide) Ozempic, Wegovy (semaglutide) Mounjaro (tirzepatide) Bydureon Bcise (exanatide extended release)  DO NOT TAKE 1 DAY PRIOR TO YOUR TEST Rybelsus (semaglutide) Adlyxin (lixisenatide) Victoza (liraglutide) Byetta (exanatide) ___________________________________________________________________________ _______________________________________________________  If your blood pressure at your visit was 140/90 or greater, please contact your primary care physician to follow up on this.  _______________________________________________________  If you are age 65 or older, your body mass index should be between 23-30. Your Body mass index is 30.54 kg/m. If this is out of the aforementioned range listed, please consider follow up with your Primary Care Provider.  If you are age 61 or younger, your body mass index should be between 19-25. Your Body mass index is 30.54 kg/m. If this is out of the aformentioned range listed, please consider follow up with your Primary Care Provider.   ________________________________________________________  The [facility] providers would like to encourage you to use [facility] to communicate with providers for non-urgent requests or questions.  Due to long hold times on the telephone, sending your provider a message by [facility] may be a faster and more efficient way to get a response.  Please allow 48 business hours for a response.  Please remember that this is for non-urgent requests.  _______________________________________________________

## 2022-11-27 ENCOUNTER — Ambulatory Visit: Payer: Medicare HMO | Admitting: Family Medicine

## 2022-11-27 VITALS — BP 126/84 | HR 80 | Ht 62.0 in | Wt 165.0 lb

## 2022-11-27 DIAGNOSIS — Z6830 Body mass index (BMI) 30.0-30.9, adult: Secondary | ICD-10-CM | POA: Diagnosis not present

## 2022-11-27 DIAGNOSIS — M79671 Pain in right foot: Secondary | ICD-10-CM

## 2022-11-27 DIAGNOSIS — M25511 Pain in right shoulder: Secondary | ICD-10-CM

## 2022-11-27 DIAGNOSIS — G8929 Other chronic pain: Secondary | ICD-10-CM

## 2022-11-27 DIAGNOSIS — M25512 Pain in left shoulder: Secondary | ICD-10-CM | POA: Diagnosis not present

## 2022-11-27 DIAGNOSIS — K219 Gastro-esophageal reflux disease without esophagitis: Secondary | ICD-10-CM | POA: Diagnosis not present

## 2022-11-27 NOTE — Patient Instructions (Signed)
Thank you for coming in today.   Continue duloxetine for pain and depression.  It may make you sleepy; you could take it at night.   Let me know how the medicine is doing in 2-4 weeks.   We can adjust it.

## 2022-11-27 NOTE — Progress Notes (Signed)
   Rubin Payor, PhD, LAT, ATC acting as a scribe for Clementeen Graham, MD.  Kayla Mueller is a 67 y.o. female who presents to [facility] today for f/u L foot pain. Pt was last seen by Dr. Denyse Amass on 11/20/22 and was given bilat GH steroid injections and a message was sent to Dr. Steward Drone. She was also advised to re-start duloxetine and was recommended self-guided cognitive behavioral therapy book for chronic pain management. Last L 2nd tarsometatarsal steroid injection was on 05/05/22.  Today, pt reports L foot pain is improved. She thinks she got a steroid injection in her L foot at the last visit. Very confused/puzzled. She trying to take care of her R foot before she considers shoulder surgery.  Dx imaging: 11/20/22 L foot XR  Pertinent review of systems: No fevers or chills  Relevant historical information: Bilateral shoulder pain, significant right midfoot DJD.  Recurrent depression.   Exam:  BP 126/84   Pulse 80   Ht 5\' 2"  (1.575 m)   Wt 165 lb (74.8 kg)   SpO2 97%   BMI 30.18 kg/m  General: Well Developed, well nourished, and in no acute distress.   MSK: Left foot nontender.  Shoulders nontender decreased range of motion. Right foot tender to palpation. Psych: Normal speech thought process and affect.   Assessment and Plan: 67 y.o. female with bilateral shoulder and right foot pain primarily thought to be due to degenerative changes.  She is getting scheduled for a midfoot fusion with Dr. Susa Simmonds pretty soon.  She will be needing shoulder replacements as well in the near future.  Would like to avoid doing any Steroid injections around the time of her foot surgery.  We talked about pain and mood.  I started low-dose Cymbalta at the last visit.  She been on this previously.  She just started it again and is tolerating it pretty well.  She will report back in 2 to 4 weeks and let me know how she is tolerating the medication.  We may need to adjust  the dose up.   PDMP not reviewed this encounter. No orders of the defined types were placed in this encounter.  No orders of the defined types were placed in this encounter.    Discussed warning signs or symptoms. Please see discharge instructions. Patient expresses understanding.   The above documentation has been reviewed and is accurate and complete Clementeen Graham, M.D.

## 2022-12-01 DIAGNOSIS — M79671 Pain in right foot: Secondary | ICD-10-CM | POA: Diagnosis not present

## 2022-12-04 DIAGNOSIS — E063 Autoimmune thyroiditis: Secondary | ICD-10-CM | POA: Diagnosis not present

## 2022-12-04 DIAGNOSIS — Z6830 Body mass index (BMI) 30.0-30.9, adult: Secondary | ICD-10-CM | POA: Diagnosis not present

## 2022-12-04 DIAGNOSIS — E559 Vitamin D deficiency, unspecified: Secondary | ICD-10-CM | POA: Diagnosis not present

## 2022-12-10 DIAGNOSIS — M19071 Primary osteoarthritis, right ankle and foot: Secondary | ICD-10-CM | POA: Diagnosis not present

## 2022-12-10 DIAGNOSIS — M545 Low back pain, unspecified: Secondary | ICD-10-CM | POA: Diagnosis not present

## 2022-12-10 DIAGNOSIS — E039 Hypothyroidism, unspecified: Secondary | ICD-10-CM | POA: Diagnosis not present

## 2022-12-10 DIAGNOSIS — Z6830 Body mass index (BMI) 30.0-30.9, adult: Secondary | ICD-10-CM | POA: Diagnosis not present

## 2022-12-11 DIAGNOSIS — Z7409 Other reduced mobility: Secondary | ICD-10-CM | POA: Diagnosis not present

## 2022-12-11 DIAGNOSIS — M2569 Stiffness of other specified joint, not elsewhere classified: Secondary | ICD-10-CM | POA: Diagnosis not present

## 2022-12-11 DIAGNOSIS — H2511 Age-related nuclear cataract, right eye: Secondary | ICD-10-CM | POA: Diagnosis not present

## 2022-12-11 DIAGNOSIS — M6281 Muscle weakness (generalized): Secondary | ICD-10-CM | POA: Diagnosis not present

## 2022-12-11 DIAGNOSIS — H2513 Age-related nuclear cataract, bilateral: Secondary | ICD-10-CM | POA: Diagnosis not present

## 2022-12-11 DIAGNOSIS — H25043 Posterior subcapsular polar age-related cataract, bilateral: Secondary | ICD-10-CM | POA: Diagnosis not present

## 2022-12-11 DIAGNOSIS — H25013 Cortical age-related cataract, bilateral: Secondary | ICD-10-CM | POA: Diagnosis not present

## 2022-12-11 DIAGNOSIS — M5459 Other low back pain: Secondary | ICD-10-CM | POA: Diagnosis not present

## 2022-12-11 DIAGNOSIS — H18413 Arcus senilis, bilateral: Secondary | ICD-10-CM | POA: Diagnosis not present

## 2022-12-12 ENCOUNTER — Encounter: Payer: Self-pay | Admitting: Internal Medicine

## 2022-12-15 ENCOUNTER — Telehealth: Payer: Self-pay | Admitting: Internal Medicine

## 2022-12-15 DIAGNOSIS — M2569 Stiffness of other specified joint, not elsewhere classified: Secondary | ICD-10-CM | POA: Diagnosis not present

## 2022-12-15 DIAGNOSIS — Z7409 Other reduced mobility: Secondary | ICD-10-CM | POA: Diagnosis not present

## 2022-12-15 DIAGNOSIS — M6281 Muscle weakness (generalized): Secondary | ICD-10-CM | POA: Diagnosis not present

## 2022-12-15 DIAGNOSIS — M5459 Other low back pain: Secondary | ICD-10-CM | POA: Diagnosis not present

## 2022-12-15 NOTE — Telephone Encounter (Signed)
Inbound call from patient stating she wishing to submit FMLA paperwork so daughter is able to be off of work and bring patient to her 11/21 procedures. Patient is requesting a call to received further guidance. Please advise, thank you.

## 2022-12-15 NOTE — Progress Notes (Signed)
Left foot x-ray shows some arthritis mainly the big toe.  The midfoot looks okay.  Would see this better with an MRI.

## 2022-12-15 NOTE — Telephone Encounter (Signed)
Spoke with pt and let her know the form should be dropped off at the office on the 3rd floor to be filled out by the provider. Also let her know there is a charge for the form to be filled out. She verbalized understanding.

## 2022-12-17 ENCOUNTER — Telehealth: Payer: Self-pay

## 2022-12-17 NOTE — Telephone Encounter (Signed)
Patient brought in FMLA paperwork her daughter's employer is requiring for her to take a day off to patient's caregiver for her upcoming procedure.  Paperwork completed and patient will pick up tomorrow.

## 2022-12-18 NOTE — Telephone Encounter (Signed)
Spoke with patient to let her know I scanned and emailed her paperwork per her request. She will call me back if she needs anything else.

## 2022-12-22 DIAGNOSIS — M2569 Stiffness of other specified joint, not elsewhere classified: Secondary | ICD-10-CM | POA: Diagnosis not present

## 2022-12-22 DIAGNOSIS — M6281 Muscle weakness (generalized): Secondary | ICD-10-CM | POA: Diagnosis not present

## 2022-12-22 DIAGNOSIS — M5459 Other low back pain: Secondary | ICD-10-CM | POA: Diagnosis not present

## 2022-12-22 DIAGNOSIS — Z7409 Other reduced mobility: Secondary | ICD-10-CM | POA: Diagnosis not present

## 2022-12-25 ENCOUNTER — Encounter: Payer: Self-pay | Admitting: Family Medicine

## 2022-12-25 ENCOUNTER — Encounter: Payer: Self-pay | Admitting: Internal Medicine

## 2022-12-25 ENCOUNTER — Ambulatory Visit: Payer: Medicare HMO | Admitting: Internal Medicine

## 2022-12-25 VITALS — BP 110/62 | HR 94 | Temp 98.0°F | Resp 15 | Ht 62.0 in | Wt 167.0 lb

## 2022-12-25 DIAGNOSIS — R1310 Dysphagia, unspecified: Secondary | ICD-10-CM

## 2022-12-25 DIAGNOSIS — D122 Benign neoplasm of ascending colon: Secondary | ICD-10-CM

## 2022-12-25 DIAGNOSIS — Z1211 Encounter for screening for malignant neoplasm of colon: Secondary | ICD-10-CM

## 2022-12-25 DIAGNOSIS — K222 Esophageal obstruction: Secondary | ICD-10-CM

## 2022-12-25 DIAGNOSIS — F32A Depression, unspecified: Secondary | ICD-10-CM | POA: Diagnosis not present

## 2022-12-25 DIAGNOSIS — K219 Gastro-esophageal reflux disease without esophagitis: Secondary | ICD-10-CM | POA: Diagnosis not present

## 2022-12-25 DIAGNOSIS — K633 Ulcer of intestine: Secondary | ICD-10-CM

## 2022-12-25 DIAGNOSIS — D123 Benign neoplasm of transverse colon: Secondary | ICD-10-CM | POA: Diagnosis not present

## 2022-12-25 DIAGNOSIS — K5190 Ulcerative colitis, unspecified, without complications: Secondary | ICD-10-CM | POA: Diagnosis not present

## 2022-12-25 DIAGNOSIS — K648 Other hemorrhoids: Secondary | ICD-10-CM

## 2022-12-25 DIAGNOSIS — E039 Hypothyroidism, unspecified: Secondary | ICD-10-CM | POA: Diagnosis not present

## 2022-12-25 DIAGNOSIS — I2510 Atherosclerotic heart disease of native coronary artery without angina pectoris: Secondary | ICD-10-CM | POA: Diagnosis not present

## 2022-12-25 DIAGNOSIS — K5289 Other specified noninfective gastroenteritis and colitis: Secondary | ICD-10-CM | POA: Diagnosis not present

## 2022-12-25 DIAGNOSIS — G4730 Sleep apnea, unspecified: Secondary | ICD-10-CM | POA: Diagnosis not present

## 2022-12-25 MED ORDER — SODIUM CHLORIDE 0.9 % IV SOLN: 500.0000 mL | Freq: Once | INTRAVENOUS | Status: DC

## 2022-12-25 NOTE — Patient Instructions (Addendum)
Resume previous diet Continue present medications including Pantoprazole(Protonix) daily, 30 minutes before eating for acid reflux Await pathology results Repeat colonoscopy in 7 years Office visit in one year with Dr Marina Goodell Handouts/information given for GERD, hiatal hernia, esophageal ring, polyps and hemorrhoids  YOU HAD AN ENDOSCOPIC PROCEDURE TODAY AT THE [facility]:   Refer to the procedure report that was given to you for any specific questions about what was found during the examination.  If the procedure report does not answer your questions, please call your gastroenterologist to clarify.  If you requested that your care partner not be given the details of your procedure findings, then the procedure report has been included in a sealed envelope for you to review at your convenience later.  YOU SHOULD EXPECT: Some feelings of bloating in the abdomen. Passage of more gas than usual.  Walking can help get rid of the air that was put into your GI tract during the procedure and reduce the bloating. If you had a lower endoscopy (such as a colonoscopy or flexible sigmoidoscopy) you may notice spotting of blood in your stool or on the toilet paper. If you underwent a bowel prep for your procedure, you may not have a normal bowel movement for a few days.  Please Note:  You might notice some irritation and congestion in your nose or some drainage.  This is from the oxygen used during your procedure.  There is no need for concern and it should clear up in a day or so.  SYMPTOMS TO REPORT IMMEDIATELY:  Following lower endoscopy (colonoscopy):  Excessive amounts of blood in the stool  Significant tenderness or worsening of abdominal pains  Swelling of the abdomen that is new, acute  Fever of 100F or higher Following upper endoscopy (EGD)  Vomiting of blood or coffee ground material  New chest pain or pain under the shoulder blades  Painful or persistently difficult swallowing  New  shortness of breath  Black, tarry-looking stools  For urgent or emergent issues, a gastroenterologist can be reached at any hour by calling [phone]. Do not use MyChart messaging for urgent concerns.   DIET:  We do recommend a small meal at first, but then you may proceed to your regular diet.  Drink plenty of fluids but you should avoid alcoholic beverages for 24 hours.  ACTIVITY:  You should plan to take it easy for the rest of today and you should NOT DRIVE or use heavy machinery until tomorrow (because of the sedation medicines used during the test).    FOLLOW UP: Our staff will call the number listed on your records the next business day following your procedure.  We will call around 7:15- 8:00 am to check on you and address any questions or concerns that you may have regarding the information given to you following your procedure. If we do not reach you, we will leave a message.     If any biopsies were taken you will be contacted by phone or by letter within the next 1-3 weeks.  Please call us at [phone] if you have not heard about the biopsies in 3 weeks.   SIGNATURES/CONFIDENTIALITY: You and/or your care partner have signed paperwork which will be entered into your electronic medical record.  These signatures attest to the fact that that the information above on your After Visit Summary has been reviewed and is understood.  Full responsibility of the confidentiality of this discharge information lies with you and/or your  care-partner.

## 2022-12-25 NOTE — Progress Notes (Signed)
Report to PACU, RN, vss, BBS= Clear.  

## 2022-12-25 NOTE — Progress Notes (Signed)
Called to room to assist during endoscopic procedure.  Patient ID and intended procedure confirmed with present staff. Received instructions for my participation in the procedure from the performing physician.  

## 2022-12-25 NOTE — Op Note (Signed)
[facility] Patient Name: Kayla Mueller Procedure Date: 12/25/2022 2:22 PM MRN: [id number] Endoscopist: Wilhemina Bonito. Marina Goodell , MD, [id number] Age: 67 Referring MD:  Date of Birth: 1955-08-22 Gender: Female Account #: [id number] Procedure:                Upper GI endoscopy Indications:              Esophageal reflux. Doing better on pantoprazole.                            The atypical pill dysphagia (aversion) Medicines:                Monitored Anesthesia Care Procedure:                Pre-Anesthesia Assessment:                           - Prior to the procedure, a History and Physical                            was performed, and patient medications and                            allergies were reviewed. The patient's tolerance of                            previous anesthesia was also reviewed. The risks                            and benefits of the procedure and the sedation                            options and risks were discussed with the patient.                            All questions were answered, and informed consent                            was obtained. Prior Anticoagulants: The patient has                            taken no anticoagulant or antiplatelet agents. ASA                            Grade Assessment: II - A patient with mild systemic                            disease. After reviewing the risks and benefits,                            the patient was deemed in satisfactory condition to                            undergo the procedure.  After obtaining informed consent, the endoscope was                            passed under direct vision. Throughout the                            procedure, the patient's blood pressure, pulse, and                            oxygen saturations were monitored continuously. The                            Olympus Scope [id number] was introduced through the                            mouth, and  advanced to the second part of duodenum.                            The upper GI endoscopy was accomplished without                            difficulty. The patient tolerated the procedure                            well. Scope In: Scope Out: Findings:                 The esophagus was normal. No Barrett's or                            inflammation. Large caliber nonobstructing ring.                           The stomach was normal, save small hiatal hernia.                           The examined duodenum was normal.                           The cardia and gastric fundus were normal on                            retroflexion. Complications:            No immediate complications. Estimated Blood Loss:     Estimated blood loss: none. Impression:               1. GERD                           2. Incidental large caliber distal esophageal ring.                            Nonobstructing. Otherwise normal esophagus                           3. Otherwise normal EGD. Recommendation:           -  Patient has a contact number available for                            emergencies. The signs and symptoms of potential                            delayed complications were discussed with the                            patient. Return to normal activities tomorrow.                            Written discharge instructions were provided to the                            patient.                           - Resume previous diet.                           - Continue present medications.                           - Continue on pantoprazole daily for your acid                            reflux                           - Office follow-up with Dr. Marina Goodell in 1 year Wilhemina Bonito. Marina Goodell, MD 12/25/2022 2:59:47 PM This report has been signed electronically.

## 2022-12-25 NOTE — Progress Notes (Signed)
Expand All Collapse All HISTORY OF PRESENT ILLNESS:   Kayla Mueller is a 67 y.o. female with past medical history as listed below.  She is sent today by her primary care provider regarding the need for colonoscopy and chronic GERD with dysphagia requesting upper endoscopy.  She is a native of [place].  Relocated to [place] about 3 years ago after the passing of her husband, to be with her daughter and grandchildren.   Patient tells me that she underwent colonoscopy in [place] about 10 or 11 years ago.  She recalls this being unremarkable with a recommendation for follow-up colonoscopy in 10 years.  No family history of colon cancer.  No lower GI complaints such as change in bowel habits or rectal bleeding.   She tells me that she has had severe daily reflux for some time.  Describes pyrosis and regurgitation.  She does have dysphagia to pills (sounds like pill aversion) but otherwise swallows well.  She has not had prior upper endoscopy.  She takes famotidine daily.  Despite this, significant active reflux symptoms.  She sounds miserable with her reflux.   Review of outside evaluation with her PCP from April 2024.  Included our laboratories.  Normal comprehensive metabolic panel.  Normal CBC with hemoglobin 13.7.  She is on semaglutide.   REVIEW OF SYSTEMS:   All non-GI ROS negative unless otherwise stated in the HPI except for arthritis, excessive urination       Past Medical History:  Diagnosis Date   Allergic rhinitis     Aortic atherosclerosis (HCC)     Atherosclerotic heart disease of native coronary artery without angina pectoris     Breast cancer (HCC)      right   Depression     GERD (gastroesophageal reflux disease)     Hypothyroid     Osteopenia      right hip   Sleep apnea      uses CPAP nightly               Past Surgical History:  Procedure Laterality Date   bilateral arthroscopic knee surgery       BREAST LUMPECTOMY Right      about 12 years ago    CESAREAN SECTION        x2          Social History Kayla Mueller  reports that she has never smoked. She has never used smokeless tobacco. She reports current alcohol use. She reports that she does not use drugs.   family history includes Neuropathy in her half-sibling; Sleep apnea in her sister; Stroke in her father and mother.   Allergies  No Known Allergies         PHYSICAL EXAMINATION: Vital signs: BP (!) 140/80   Pulse 80   Ht 5\' 2"  (1.575 m)   Wt 167 lb (75.8 kg)   BMI 30.54 kg/m   Constitutional: generally well-appearing, no acute distress Psychiatric: alert and oriented x3, cooperative Eyes: extraocular movements intact, anicteric, conjunctiva pink Mouth: oral pharynx moist, no lesions Neck: supple no lymphadenopathy Cardiovascular: heart regular rate and rhythm, no murmur Lungs: clear to auscultation bilaterally Abdomen: soft, nontender, nondistended, no obvious ascites, no peritoneal signs, normal bowel sounds, no organomegaly Rectal: Deferred until colonoscopy Extremities: no clubbing, cyanosis, or lower extremity edema bilaterally Skin: no lesions on visible extremities Neuro: No focal deficits.  Cranial nerves intact   ASSESSMENT:   1.  Longstanding chronic GERD.  Active symptoms despite daily H2  receptor antagonist therapy.  Rule out Barrett's. 2.  Pill dysphagia.  Sounds like pill aversion.  Needs evaluated. 3.  Colon cancer screening.  Sounds like negative colonoscopy 10 or 11 years ago in [place].  No active symptoms.  Normal hemoglobin     PLAN:   1.  Reflux precautions 2.  Prescribe pantoprazole 40 mg daily; #30; multiple refills.  Medication effects and side effects reviewed 3.  Stop famotidine 4.  Schedule upper endoscopy to rule out Barrett's esophagus and evaluate dysphagia. 5.  Schedule colonoscopy for the purposes of screening. 6.  Hold semaglutide at least 1 week prior to the procedures.  She understood and agreed. 7.  Ongoing  general medical care with Dr. Renne Crigler

## 2022-12-25 NOTE — Op Note (Signed)
[facility] Patient Name: Kayla Mueller Procedure Date: 12/25/2022 2:23 PM MRN: [id number] Endoscopist: Wilhemina Bonito. Marina Goodell , MD, [id number] Age: 67 Referring MD:  Date of Birth: [birth year] Gender: Female Account #: [id number] Procedure:                Colonoscopy with cold snare polypectomy x 2; with                            biopsies Indications:              Screening for colorectal malignant neoplasm.                            Reports negative index colonoscopy about 10 years                            ago in [place]. Now for follow-up screening Medicines:                Monitored Anesthesia Care Procedure:                Pre-Anesthesia Assessment:                           - Prior to the procedure, a History and Physical                            was performed, and patient medications and                            allergies were reviewed. The patient's tolerance of                            previous anesthesia was also reviewed. The risks                            and benefits of the procedure and the sedation                            options and risks were discussed with the patient.                            All questions were answered, and informed consent                            was obtained. Prior Anticoagulants: The patient has                            taken no anticoagulant or antiplatelet agents.                            After reviewing the risks and benefits, the patient                            was deemed in satisfactory condition to undergo the  procedure.                           After obtaining informed consent, the colonoscope                            was passed under direct vision. Throughout the                            procedure, the patient's blood pressure, pulse, and                            oxygen saturations were monitored continuously. The                            CF [id number] #[id number] was  introduced through the anus                            and advanced to the the cecum, identified by                            appendiceal orifice and ileocecal valve. The                            ileocecal valve, appendiceal orifice, and rectum                            were photographed. The quality of the bowel                            preparation was excellent. The colonoscopy was                            performed without difficulty. The patient tolerated                            the procedure well. The bowel preparation used was                            SUPREP via split dose instruction. Scope In: 2:27:40 PM Scope Out: 2:43:55 PM Scope Withdrawal Time: 0 hours 11 minutes 44 seconds  Total Procedure Duration: 0 hours 16 minutes 15 seconds  Findings:                 Two polyps were found in the transverse colon and                            ascending colon. The polyps were 3 to 5 mm in size.                            These polyps were removed with a cold snare.                            Resection and retrieval were complete.  There was a benign-appearing erosion in the mid                            transverse colon. This was biopsied. Small internal                            hemorrhoids.                           The exam was otherwise without abnormality on                            direct and retroflexion views. Complications:            No immediate complications. Estimated blood loss:                            None. Estimated Blood Loss:     Estimated blood loss: none. Impression:               - Two 3 to 5 mm polyps in the transverse colon and                            in the ascending colon, removed with a cold snare.                            Resected and retrieved.                           - The examination was otherwise normal on direct                            and retroflexion views. Small internal hemorrhoids                             noted. Recommendation:           - Repeat colonoscopy in 7 years for surveillance.                           - Patient has a contact number available for                            emergencies. The signs and symptoms of potential                            delayed complications were discussed with the                            patient. Return to normal activities tomorrow.                            Written discharge instructions were provided to the                            patient.                           -  Resume previous diet.                           - Continue present medications.                           - Await pathology results. Wilhemina Bonito. Marina Goodell, MD 12/25/2022 2:50:06 PM This report has been signed electronically.

## 2022-12-26 ENCOUNTER — Telehealth: Payer: Self-pay

## 2022-12-26 DIAGNOSIS — M6281 Muscle weakness (generalized): Secondary | ICD-10-CM | POA: Diagnosis not present

## 2022-12-26 DIAGNOSIS — Z6829 Body mass index (BMI) 29.0-29.9, adult: Secondary | ICD-10-CM | POA: Diagnosis not present

## 2022-12-26 DIAGNOSIS — M2569 Stiffness of other specified joint, not elsewhere classified: Secondary | ICD-10-CM | POA: Diagnosis not present

## 2022-12-26 DIAGNOSIS — Z7409 Other reduced mobility: Secondary | ICD-10-CM | POA: Diagnosis not present

## 2022-12-26 DIAGNOSIS — M5459 Other low back pain: Secondary | ICD-10-CM | POA: Diagnosis not present

## 2022-12-26 DIAGNOSIS — R7303 Prediabetes: Secondary | ICD-10-CM | POA: Diagnosis not present

## 2022-12-26 NOTE — Telephone Encounter (Signed)
LMOM- left detailed message that there is no limitations to anything today, including medications (injections).  Pt is ok to proceed as scheduled

## 2022-12-26 NOTE — Telephone Encounter (Addendum)
Patient called requesting a call back, she states she is supposed to be getting an injection today at 52 and is wanting to know if that is okay due to having her procedure yesterday.  Please advise.

## 2022-12-26 NOTE — Telephone Encounter (Signed)
  Follow up Call-     12/25/2022    2:00 PM  Call back number  Post procedure Call Back phone  # [phone]  Permission to leave phone message Yes   Follow up, LVM

## 2022-12-29 DIAGNOSIS — M5459 Other low back pain: Secondary | ICD-10-CM | POA: Diagnosis not present

## 2022-12-29 DIAGNOSIS — M6281 Muscle weakness (generalized): Secondary | ICD-10-CM | POA: Diagnosis not present

## 2022-12-29 DIAGNOSIS — Z7409 Other reduced mobility: Secondary | ICD-10-CM | POA: Diagnosis not present

## 2022-12-29 DIAGNOSIS — M2569 Stiffness of other specified joint, not elsewhere classified: Secondary | ICD-10-CM | POA: Diagnosis not present

## 2022-12-31 ENCOUNTER — Encounter: Payer: Self-pay | Admitting: Internal Medicine

## 2022-12-31 DIAGNOSIS — Z7409 Other reduced mobility: Secondary | ICD-10-CM | POA: Diagnosis not present

## 2022-12-31 DIAGNOSIS — M6281 Muscle weakness (generalized): Secondary | ICD-10-CM | POA: Diagnosis not present

## 2022-12-31 DIAGNOSIS — M5459 Other low back pain: Secondary | ICD-10-CM | POA: Diagnosis not present

## 2022-12-31 DIAGNOSIS — M2569 Stiffness of other specified joint, not elsewhere classified: Secondary | ICD-10-CM | POA: Diagnosis not present

## 2022-12-31 LAB — SURGICAL PATHOLOGY

## 2023-01-02 DIAGNOSIS — M2569 Stiffness of other specified joint, not elsewhere classified: Secondary | ICD-10-CM | POA: Diagnosis not present

## 2023-01-02 DIAGNOSIS — M5459 Other low back pain: Secondary | ICD-10-CM | POA: Diagnosis not present

## 2023-01-02 DIAGNOSIS — Z7409 Other reduced mobility: Secondary | ICD-10-CM | POA: Diagnosis not present

## 2023-01-02 DIAGNOSIS — M6281 Muscle weakness (generalized): Secondary | ICD-10-CM | POA: Diagnosis not present

## 2023-01-06 DIAGNOSIS — Z6829 Body mass index (BMI) 29.0-29.9, adult: Secondary | ICD-10-CM | POA: Diagnosis not present

## 2023-01-06 DIAGNOSIS — E063 Autoimmune thyroiditis: Secondary | ICD-10-CM | POA: Diagnosis not present

## 2023-01-06 DIAGNOSIS — R7303 Prediabetes: Secondary | ICD-10-CM | POA: Diagnosis not present

## 2023-01-07 DIAGNOSIS — M6281 Muscle weakness (generalized): Secondary | ICD-10-CM | POA: Diagnosis not present

## 2023-01-07 DIAGNOSIS — M2569 Stiffness of other specified joint, not elsewhere classified: Secondary | ICD-10-CM | POA: Diagnosis not present

## 2023-01-07 DIAGNOSIS — Z7409 Other reduced mobility: Secondary | ICD-10-CM | POA: Diagnosis not present

## 2023-01-07 DIAGNOSIS — M5459 Other low back pain: Secondary | ICD-10-CM | POA: Diagnosis not present

## 2023-01-12 DIAGNOSIS — M5459 Other low back pain: Secondary | ICD-10-CM | POA: Diagnosis not present

## 2023-01-12 DIAGNOSIS — M6281 Muscle weakness (generalized): Secondary | ICD-10-CM | POA: Diagnosis not present

## 2023-01-12 DIAGNOSIS — Z7409 Other reduced mobility: Secondary | ICD-10-CM | POA: Diagnosis not present

## 2023-01-12 DIAGNOSIS — M2569 Stiffness of other specified joint, not elsewhere classified: Secondary | ICD-10-CM | POA: Diagnosis not present

## 2023-01-13 DIAGNOSIS — E063 Autoimmune thyroiditis: Secondary | ICD-10-CM | POA: Diagnosis not present

## 2023-01-13 DIAGNOSIS — R7303 Prediabetes: Secondary | ICD-10-CM | POA: Diagnosis not present

## 2023-01-13 DIAGNOSIS — Z6829 Body mass index (BMI) 29.0-29.9, adult: Secondary | ICD-10-CM | POA: Diagnosis not present

## 2023-01-15 DIAGNOSIS — M5459 Other low back pain: Secondary | ICD-10-CM | POA: Diagnosis not present

## 2023-01-15 DIAGNOSIS — M2569 Stiffness of other specified joint, not elsewhere classified: Secondary | ICD-10-CM | POA: Diagnosis not present

## 2023-01-15 DIAGNOSIS — Z7409 Other reduced mobility: Secondary | ICD-10-CM | POA: Diagnosis not present

## 2023-01-15 DIAGNOSIS — M6281 Muscle weakness (generalized): Secondary | ICD-10-CM | POA: Diagnosis not present

## 2023-01-16 ENCOUNTER — Other Ambulatory Visit: Payer: Self-pay | Admitting: Family Medicine

## 2023-01-20 DIAGNOSIS — K219 Gastro-esophageal reflux disease without esophagitis: Secondary | ICD-10-CM | POA: Diagnosis not present

## 2023-01-20 DIAGNOSIS — Z6829 Body mass index (BMI) 29.0-29.9, adult: Secondary | ICD-10-CM | POA: Diagnosis not present

## 2023-01-29 DIAGNOSIS — R7303 Prediabetes: Secondary | ICD-10-CM | POA: Diagnosis not present

## 2023-01-29 DIAGNOSIS — E559 Vitamin D deficiency, unspecified: Secondary | ICD-10-CM | POA: Diagnosis not present

## 2023-01-29 DIAGNOSIS — Z6829 Body mass index (BMI) 29.0-29.9, adult: Secondary | ICD-10-CM | POA: Diagnosis not present

## 2023-01-30 NOTE — Progress Notes (Unsigned)
   Rubin Payor, PhD, LAT, ATC acting as a scribe for Clementeen Graham, MD.  Talon Semelsberger is a 67 y.o. female who presents to [facility] at [facility] today for cont'd R foot pain. Pt was last seen by Dr. Denyse Amass on 11/27/22 and was advised to cont Cymbalta. Last R dorsal midfoot injection was lat 08/14/22.  Today, pt reports ***  Dx imaging: 04/19/21 R foot XR   Pertinent review of systems: ***  Relevant historical information: ***   Exam:  There were no vitals taken for this visit. General: Well Developed, well nourished, and in no acute distress.   MSK: ***    Lab and Radiology Results No results found for this or any previous visit (from the past 72 hours). No results found.     Assessment and Plan: 67 y.o. female with ***   PDMP not reviewed this encounter. No orders of the defined types were placed in this encounter.  No orders of the defined types were placed in this encounter.    Discussed warning signs or symptoms. Please see discharge instructions. Patient expresses understanding.   ***

## 2023-02-02 ENCOUNTER — Ambulatory Visit: Payer: Medicare HMO | Admitting: Family Medicine

## 2023-02-02 ENCOUNTER — Encounter: Payer: Self-pay | Admitting: Family Medicine

## 2023-02-02 ENCOUNTER — Other Ambulatory Visit: Payer: Self-pay

## 2023-02-02 ENCOUNTER — Telehealth: Payer: Self-pay

## 2023-02-02 VITALS — BP 110/82 | HR 95 | Ht 62.0 in | Wt 160.0 lb

## 2023-02-02 DIAGNOSIS — M79671 Pain in right foot: Secondary | ICD-10-CM | POA: Diagnosis not present

## 2023-02-02 DIAGNOSIS — F339 Major depressive disorder, recurrent, unspecified: Secondary | ICD-10-CM

## 2023-02-02 NOTE — Patient Instructions (Addendum)
Thank you for coming in today.   You received an injection today. Seek immediate medical attention if the joint becomes red, extremely painful, or is oozing fluid.   Consider GeneSight testing  Consider that book on chronic pain management.  The Pain Management Workbook: Powerful CBT and Mindfulness Skills to Take Control of Pain and Reclaim Your Life  Dr Lajoyce Corners and Dr Susa Simmonds both in [place].

## 2023-02-02 NOTE — Telephone Encounter (Signed)
Pt called c/o increased pain in her R foot. She was given a CSI earlier today and her pain seemed like a steroid flare of the lidocaine wearing off and the steroid starting to act. She had a very similar reaction to the CSI in her shoulders back in Oct. She recalls this incident. Discussed concerning s/s of infection and also advised of our 1/2 day tomorrow. She verbalized understanding.

## 2023-02-10 DIAGNOSIS — E782 Mixed hyperlipidemia: Secondary | ICD-10-CM | POA: Diagnosis not present

## 2023-02-10 DIAGNOSIS — E063 Autoimmune thyroiditis: Secondary | ICD-10-CM | POA: Diagnosis not present

## 2023-02-10 DIAGNOSIS — Z6828 Body mass index (BMI) 28.0-28.9, adult: Secondary | ICD-10-CM | POA: Diagnosis not present

## 2023-02-13 DIAGNOSIS — Z1231 Encounter for screening mammogram for malignant neoplasm of breast: Secondary | ICD-10-CM | POA: Diagnosis not present

## 2023-02-13 DIAGNOSIS — Z01419 Encounter for gynecological examination (general) (routine) without abnormal findings: Secondary | ICD-10-CM | POA: Diagnosis not present

## 2023-02-13 DIAGNOSIS — Z6829 Body mass index (BMI) 29.0-29.9, adult: Secondary | ICD-10-CM | POA: Diagnosis not present

## 2023-02-13 DIAGNOSIS — N760 Acute vaginitis: Secondary | ICD-10-CM | POA: Diagnosis not present

## 2023-02-14 ENCOUNTER — Other Ambulatory Visit: Payer: Self-pay | Admitting: Family Medicine

## 2023-02-18 DIAGNOSIS — H2511 Age-related nuclear cataract, right eye: Secondary | ICD-10-CM | POA: Diagnosis not present

## 2023-02-19 DIAGNOSIS — H2512 Age-related nuclear cataract, left eye: Secondary | ICD-10-CM | POA: Diagnosis not present

## 2023-03-02 DIAGNOSIS — K219 Gastro-esophageal reflux disease without esophagitis: Secondary | ICD-10-CM | POA: Diagnosis not present

## 2023-03-02 DIAGNOSIS — Z6828 Body mass index (BMI) 28.0-28.9, adult: Secondary | ICD-10-CM | POA: Diagnosis not present

## 2023-03-02 DIAGNOSIS — R7303 Prediabetes: Secondary | ICD-10-CM | POA: Diagnosis not present

## 2023-03-03 DIAGNOSIS — R7309 Other abnormal glucose: Secondary | ICD-10-CM | POA: Diagnosis not present

## 2023-03-03 DIAGNOSIS — E039 Hypothyroidism, unspecified: Secondary | ICD-10-CM | POA: Diagnosis not present

## 2023-03-17 DIAGNOSIS — Z6827 Body mass index (BMI) 27.0-27.9, adult: Secondary | ICD-10-CM | POA: Diagnosis not present

## 2023-03-17 DIAGNOSIS — E039 Hypothyroidism, unspecified: Secondary | ICD-10-CM | POA: Diagnosis not present

## 2023-03-25 DIAGNOSIS — H2512 Age-related nuclear cataract, left eye: Secondary | ICD-10-CM | POA: Diagnosis not present

## 2023-04-09 DIAGNOSIS — M549 Dorsalgia, unspecified: Secondary | ICD-10-CM | POA: Diagnosis not present

## 2023-04-09 DIAGNOSIS — M7731 Calcaneal spur, right foot: Secondary | ICD-10-CM | POA: Diagnosis not present

## 2023-04-09 DIAGNOSIS — M19071 Primary osteoarthritis, right ankle and foot: Secondary | ICD-10-CM | POA: Diagnosis not present

## 2023-04-09 DIAGNOSIS — M79671 Pain in right foot: Secondary | ICD-10-CM | POA: Diagnosis not present

## 2023-04-09 DIAGNOSIS — M2021 Hallux rigidus, right foot: Secondary | ICD-10-CM | POA: Diagnosis not present

## 2023-04-09 DIAGNOSIS — M19072 Primary osteoarthritis, left ankle and foot: Secondary | ICD-10-CM | POA: Diagnosis not present

## 2023-04-14 DIAGNOSIS — E039 Hypothyroidism, unspecified: Secondary | ICD-10-CM | POA: Diagnosis not present

## 2023-04-15 DIAGNOSIS — K219 Gastro-esophageal reflux disease without esophagitis: Secondary | ICD-10-CM | POA: Diagnosis not present

## 2023-04-15 DIAGNOSIS — Z6827 Body mass index (BMI) 27.0-27.9, adult: Secondary | ICD-10-CM | POA: Diagnosis not present

## 2023-04-16 DIAGNOSIS — M19072 Primary osteoarthritis, left ankle and foot: Secondary | ICD-10-CM | POA: Diagnosis not present

## 2023-04-16 DIAGNOSIS — M2142 Flat foot [pes planus] (acquired), left foot: Secondary | ICD-10-CM | POA: Diagnosis not present

## 2023-04-16 DIAGNOSIS — M2141 Flat foot [pes planus] (acquired), right foot: Secondary | ICD-10-CM | POA: Diagnosis not present

## 2023-04-16 DIAGNOSIS — M19071 Primary osteoarthritis, right ankle and foot: Secondary | ICD-10-CM | POA: Diagnosis not present

## 2023-04-28 DIAGNOSIS — E559 Vitamin D deficiency, unspecified: Secondary | ICD-10-CM | POA: Diagnosis not present

## 2023-04-28 DIAGNOSIS — E063 Autoimmune thyroiditis: Secondary | ICD-10-CM | POA: Diagnosis not present

## 2023-04-28 DIAGNOSIS — Z6827 Body mass index (BMI) 27.0-27.9, adult: Secondary | ICD-10-CM | POA: Diagnosis not present

## 2023-04-28 DIAGNOSIS — K5900 Constipation, unspecified: Secondary | ICD-10-CM | POA: Diagnosis not present

## 2023-05-06 DIAGNOSIS — M47817 Spondylosis without myelopathy or radiculopathy, lumbosacral region: Secondary | ICD-10-CM | POA: Diagnosis not present

## 2023-05-06 DIAGNOSIS — M5137 Other intervertebral disc degeneration, lumbosacral region with discogenic back pain only: Secondary | ICD-10-CM | POA: Diagnosis not present

## 2023-05-06 DIAGNOSIS — M47816 Spondylosis without myelopathy or radiculopathy, lumbar region: Secondary | ICD-10-CM | POA: Diagnosis not present

## 2023-05-06 DIAGNOSIS — G8929 Other chronic pain: Secondary | ICD-10-CM | POA: Diagnosis not present

## 2023-05-06 DIAGNOSIS — M545 Low back pain, unspecified: Secondary | ICD-10-CM | POA: Diagnosis not present

## 2023-05-12 NOTE — Progress Notes (Unsigned)
   Rubin Payor, PhD, LAT, ATC acting as a scribe for Clementeen Graham, MD.  Kayla Mueller is a 68 y.o. female who presents to [facility] at [facility] today for exacerbation of her R shoulder pain. Pt's last visit w/ Dr. Denyse Amass for her shoulders was on 11/20/22 and was given bilat GH steroid injections.  Today, pt reports ***  Dx imaging:  05/20/22 R shoulder CT             03/25/22 R & L shoulder XR 04/19/21 R shoulder XR 09/08/19 R shoulder MRI             06/24/19 R & L shoulder XR  Pertinent review of systems: ***  Relevant historical information: ***   Exam:  There were no vitals taken for this visit. General: Well Developed, well nourished, and in no acute distress.   MSK: ***    Lab and Radiology Results No results found for this or any previous visit (from the past 72 hours). No results found.     Assessment and Plan: 68 y.o. female with ***   PDMP not reviewed this encounter. No orders of the defined types were placed in this encounter.  No orders of the defined types were placed in this encounter.    Discussed warning signs or symptoms. Please see discharge instructions. Patient expresses understanding.   ***

## 2023-05-13 ENCOUNTER — Other Ambulatory Visit: Payer: Self-pay

## 2023-05-13 ENCOUNTER — Encounter: Payer: Self-pay | Admitting: Family Medicine

## 2023-05-13 ENCOUNTER — Ambulatory Visit: Admitting: Family Medicine

## 2023-05-13 VITALS — BP 130/84 | HR 86 | Ht 62.0 in | Wt 163.0 lb

## 2023-05-13 DIAGNOSIS — M25511 Pain in right shoulder: Secondary | ICD-10-CM

## 2023-05-13 DIAGNOSIS — G8929 Other chronic pain: Secondary | ICD-10-CM

## 2023-05-13 DIAGNOSIS — M25512 Pain in left shoulder: Secondary | ICD-10-CM

## 2023-05-13 MED ORDER — TRAMADOL HCL 50 MG PO TABS
50.0000 mg | ORAL_TABLET | Freq: Three times a day (TID) | ORAL | 0 refills | Status: DC | PRN
Start: 2023-05-13 — End: 2023-09-08

## 2023-05-13 NOTE — Patient Instructions (Signed)
 Thank you for coming in today.   You received an injection today. Seek immediate medical attention if the joint becomes red, extremely painful, or is oozing fluid.   Schedule for injection for right shoulder

## 2023-05-15 ENCOUNTER — Ambulatory Visit: Admitting: Family Medicine

## 2023-05-25 ENCOUNTER — Ambulatory Visit: Admitting: Family Medicine

## 2023-05-26 DIAGNOSIS — Z01818 Encounter for other preprocedural examination: Secondary | ICD-10-CM | POA: Diagnosis not present

## 2023-05-26 DIAGNOSIS — G4733 Obstructive sleep apnea (adult) (pediatric): Secondary | ICD-10-CM | POA: Diagnosis not present

## 2023-05-26 DIAGNOSIS — E7800 Pure hypercholesterolemia, unspecified: Secondary | ICD-10-CM | POA: Diagnosis not present

## 2023-05-26 DIAGNOSIS — Z853 Personal history of malignant neoplasm of breast: Secondary | ICD-10-CM | POA: Diagnosis not present

## 2023-05-26 DIAGNOSIS — I2510 Atherosclerotic heart disease of native coronary artery without angina pectoris: Secondary | ICD-10-CM | POA: Diagnosis not present

## 2023-05-26 DIAGNOSIS — E039 Hypothyroidism, unspecified: Secondary | ICD-10-CM | POA: Diagnosis not present

## 2023-05-26 DIAGNOSIS — E063 Autoimmune thyroiditis: Secondary | ICD-10-CM | POA: Diagnosis not present

## 2023-05-26 DIAGNOSIS — M545 Low back pain, unspecified: Secondary | ICD-10-CM | POA: Diagnosis not present

## 2023-05-26 DIAGNOSIS — R7303 Prediabetes: Secondary | ICD-10-CM | POA: Diagnosis not present

## 2023-05-26 DIAGNOSIS — M19071 Primary osteoarthritis, right ankle and foot: Secondary | ICD-10-CM | POA: Diagnosis not present

## 2023-05-26 DIAGNOSIS — Z6826 Body mass index (BMI) 26.0-26.9, adult: Secondary | ICD-10-CM | POA: Diagnosis not present

## 2023-05-26 DIAGNOSIS — G8929 Other chronic pain: Secondary | ICD-10-CM | POA: Diagnosis not present

## 2023-05-27 DIAGNOSIS — Z0000 Encounter for general adult medical examination without abnormal findings: Secondary | ICD-10-CM | POA: Diagnosis not present

## 2023-05-27 DIAGNOSIS — E039 Hypothyroidism, unspecified: Secondary | ICD-10-CM | POA: Diagnosis not present

## 2023-05-27 DIAGNOSIS — R7303 Prediabetes: Secondary | ICD-10-CM | POA: Diagnosis not present

## 2023-05-27 DIAGNOSIS — I2584 Coronary atherosclerosis due to calcified coronary lesion: Secondary | ICD-10-CM | POA: Diagnosis not present

## 2023-05-27 DIAGNOSIS — E7800 Pure hypercholesterolemia, unspecified: Secondary | ICD-10-CM | POA: Diagnosis not present

## 2023-05-28 DIAGNOSIS — M47816 Spondylosis without myelopathy or radiculopathy, lumbar region: Secondary | ICD-10-CM | POA: Diagnosis not present

## 2023-06-01 DIAGNOSIS — J309 Allergic rhinitis, unspecified: Secondary | ICD-10-CM | POA: Diagnosis not present

## 2023-06-01 DIAGNOSIS — Z0000 Encounter for general adult medical examination without abnormal findings: Secondary | ICD-10-CM | POA: Diagnosis not present

## 2023-06-01 DIAGNOSIS — E039 Hypothyroidism, unspecified: Secondary | ICD-10-CM | POA: Diagnosis not present

## 2023-06-01 DIAGNOSIS — F339 Major depressive disorder, recurrent, unspecified: Secondary | ICD-10-CM | POA: Diagnosis not present

## 2023-06-01 DIAGNOSIS — G629 Polyneuropathy, unspecified: Secondary | ICD-10-CM | POA: Diagnosis not present

## 2023-06-01 DIAGNOSIS — R400 Somnolence: Secondary | ICD-10-CM | POA: Diagnosis not present

## 2023-06-01 DIAGNOSIS — N760 Acute vaginitis: Secondary | ICD-10-CM | POA: Diagnosis not present

## 2023-06-01 DIAGNOSIS — K2100 Gastro-esophageal reflux disease with esophagitis, without bleeding: Secondary | ICD-10-CM | POA: Diagnosis not present

## 2023-06-01 DIAGNOSIS — I700 Atherosclerosis of aorta: Secondary | ICD-10-CM | POA: Diagnosis not present

## 2023-06-01 DIAGNOSIS — I2510 Atherosclerotic heart disease of native coronary artery without angina pectoris: Secondary | ICD-10-CM | POA: Diagnosis not present

## 2023-06-03 ENCOUNTER — Ambulatory Visit: Admitting: Family Medicine

## 2023-06-03 ENCOUNTER — Encounter: Payer: Self-pay | Admitting: Family Medicine

## 2023-06-03 ENCOUNTER — Other Ambulatory Visit: Payer: Self-pay

## 2023-06-03 VITALS — BP 112/74 | HR 93 | Ht 62.0 in | Wt 145.0 lb

## 2023-06-03 DIAGNOSIS — G8929 Other chronic pain: Secondary | ICD-10-CM

## 2023-06-03 DIAGNOSIS — M25511 Pain in right shoulder: Secondary | ICD-10-CM | POA: Diagnosis not present

## 2023-06-03 MED ORDER — METHYLPREDNISOLONE ACETATE 40 MG/ML IJ SUSP
40.0000 mg | Freq: Once | INTRAMUSCULAR | Status: AC
  Administered 2023-06-03: 40 mg via INTRA_ARTICULAR

## 2023-06-03 NOTE — Patient Instructions (Addendum)
 Thank you for coming in today.   You received an injection today. Seek immediate medical attention if the joint becomes red, extremely painful, or is oozing fluid.   See you back as needed.

## 2023-06-03 NOTE — Progress Notes (Signed)
   I, Miquel Amen, CMA acting as a scribe for Garlan Juniper, MD.  Kayla Mueller is a 68 y.o. female who presents to [facility] at [facility] today for exacerbation of her R shoulder pain. Pt's last visit w/ Dr. Alease Hunter was on 05/13/23 and she was given a L GH steroid injection.  Today, pt reports exacerbation of right shoulder sx 2 months. Locates pain to anterior aspect of the shoulder radiating into the upper arm. C/O n/t in the right hand and fingers, worse with driving. Denies decreased grip strength. ROM well maintained but not without pain.   Patient has had bad steroid flare related pain from prior shoulder injections especially using Kenalog in the past.  Dx imaging:  05/20/22 R shoulder CT             03/25/22 R & L shoulder XR 04/19/21 R shoulder XR 09/08/19 R shoulder MRI             06/24/19 R & L shoulder XR  Pertinent review of systems: No fevers or chills  Relevant historical information: Scheduled to have a foot fusion in about 2 weeks with foot surgeon at [facility].   Exam:  BP 112/74   Pulse 93   Ht 5\' 2"  (1.575 m)   Wt 145 lb (65.8 kg)   SpO2 100%   BMI 26.52 kg/m  General: Well Developed, well nourished, and in no acute distress.   MSK: Right shoulder normal-appearing pain with abduction decreased range of motion.    Lab and Radiology Results  Procedure: Real-time Ultrasound Guided Injection of the right shoulder glenohumeral joint posterior approach Device: Philips Affiniti 50G/GE Logiq Images permanently stored and available for review in PACS Verbal informed consent obtained.  Discussed risks and benefits of procedure. Warned about infection, bleeding, hyperglycemia damage to structures among others. Patient expresses understanding and agreement Time-out conducted.   Noted no overlying erythema, induration, or other signs of local infection.   Skin prepped in a sterile fashion.   Local anesthesia: Topical Ethyl chloride.   With sterile  technique and under real time ultrasound guidance: 40 mg of Depo-Medrol  and 2 mL of Marcaine injected into subacromial bursa. Fluid seen entering the bursa.   Completed without difficulty   Pain immediately resolved suggesting accurate placement of the medication.   Advised to call if fevers/chills, erythema, induration, drainage, or persistent bleeding.   Images permanently stored and available for review in the ultrasound unit.  Impression: Technically successful ultrasound guided injection.        Assessment and Plan: 68 y.o. 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  Plan for glenohumeral injection.  We are going to try using Depo-Medrol  today as she has had more pain with Kenalog.  It may work better or provide less pain.   PDMP not reviewed this encounter. Orders Placed This Encounter  Procedures   US  LIMITED JOINT SPACE STRUCTURES UP RIGHT(NO LINKED CHARGES)    Reason for Exam (SYMPTOM  OR DIAGNOSIS REQUIRED):   right shoulder pain    Preferred imaging location?:    [facility]   Meds ordered this encounter  Medications   methylPREDNISolone  acetate (DEPO-MEDROL ) injection 40 mg     Discussed warning signs or symptoms. Please see discharge instructions. Patient expresses understanding.   The above documentation has been reviewed and is accurate and complete Garlan Juniper, M.D.

## 2023-06-17 DIAGNOSIS — M19071 Primary osteoarthritis, right ankle and foot: Secondary | ICD-10-CM | POA: Diagnosis not present

## 2023-06-17 DIAGNOSIS — I2510 Atherosclerotic heart disease of native coronary artery without angina pectoris: Secondary | ICD-10-CM | POA: Diagnosis not present

## 2023-06-17 DIAGNOSIS — E039 Hypothyroidism, unspecified: Secondary | ICD-10-CM | POA: Diagnosis not present

## 2023-06-17 DIAGNOSIS — G4733 Obstructive sleep apnea (adult) (pediatric): Secondary | ICD-10-CM | POA: Diagnosis not present

## 2023-06-17 DIAGNOSIS — Z9989 Dependence on other enabling machines and devices: Secondary | ICD-10-CM | POA: Diagnosis not present

## 2023-06-17 DIAGNOSIS — R7303 Prediabetes: Secondary | ICD-10-CM | POA: Diagnosis not present

## 2023-06-17 DIAGNOSIS — E7800 Pure hypercholesterolemia, unspecified: Secondary | ICD-10-CM | POA: Diagnosis not present

## 2023-07-01 DIAGNOSIS — M19071 Primary osteoarthritis, right ankle and foot: Secondary | ICD-10-CM | POA: Diagnosis not present

## 2023-07-01 DIAGNOSIS — Z4789 Encounter for other orthopedic aftercare: Secondary | ICD-10-CM | POA: Diagnosis not present

## 2023-07-29 ENCOUNTER — Telehealth: Payer: Self-pay | Admitting: Adult Health

## 2023-07-29 DIAGNOSIS — M19071 Primary osteoarthritis, right ankle and foot: Secondary | ICD-10-CM | POA: Diagnosis not present

## 2023-07-29 DIAGNOSIS — Z4789 Encounter for other orthopedic aftercare: Secondary | ICD-10-CM | POA: Diagnosis not present

## 2023-07-29 NOTE — Telephone Encounter (Signed)
 LVM and sent mychart msg informing pt of need to reschedule 08/27/23 appt - NP out

## 2023-08-03 DIAGNOSIS — E039 Hypothyroidism, unspecified: Secondary | ICD-10-CM | POA: Diagnosis not present

## 2023-08-11 DIAGNOSIS — E039 Hypothyroidism, unspecified: Secondary | ICD-10-CM | POA: Diagnosis not present

## 2023-08-11 DIAGNOSIS — R7303 Prediabetes: Secondary | ICD-10-CM | POA: Diagnosis not present

## 2023-08-20 DIAGNOSIS — M19071 Primary osteoarthritis, right ankle and foot: Secondary | ICD-10-CM | POA: Diagnosis not present

## 2023-08-20 DIAGNOSIS — M7731 Calcaneal spur, right foot: Secondary | ICD-10-CM | POA: Diagnosis not present

## 2023-08-20 DIAGNOSIS — M79671 Pain in right foot: Secondary | ICD-10-CM | POA: Diagnosis not present

## 2023-08-24 DIAGNOSIS — Z008 Encounter for other general examination: Secondary | ICD-10-CM | POA: Diagnosis not present

## 2023-08-27 ENCOUNTER — Ambulatory Visit: Payer: Medicare HMO | Admitting: Adult Health

## 2023-09-07 NOTE — Progress Notes (Unsigned)
 Kayla Mueller

## 2023-09-08 ENCOUNTER — Ambulatory Visit: Admitting: Adult Health

## 2023-09-08 ENCOUNTER — Telehealth: Payer: Self-pay | Admitting: Adult Health

## 2023-09-08 ENCOUNTER — Telehealth: Payer: Self-pay | Admitting: Neurology

## 2023-09-08 ENCOUNTER — Encounter: Payer: Self-pay | Admitting: Adult Health

## 2023-09-08 VITALS — BP 142/84 | HR 74 | Ht 60.0 in | Wt 162.0 lb

## 2023-09-08 DIAGNOSIS — E538 Deficiency of other specified B group vitamins: Secondary | ICD-10-CM | POA: Diagnosis not present

## 2023-09-08 DIAGNOSIS — G4719 Other hypersomnia: Secondary | ICD-10-CM

## 2023-09-08 DIAGNOSIS — G4733 Obstructive sleep apnea (adult) (pediatric): Secondary | ICD-10-CM

## 2023-09-08 DIAGNOSIS — E039 Hypothyroidism, unspecified: Secondary | ICD-10-CM

## 2023-09-08 NOTE — Telephone Encounter (Signed)
Med list corrected 

## 2023-09-08 NOTE — Telephone Encounter (Signed)
 error

## 2023-09-08 NOTE — Addendum Note (Signed)
 Addended by: ROBBERT GOSLING D on: 09/08/2023 01:21 PM   Modules accepted: Orders

## 2023-09-08 NOTE — Telephone Encounter (Signed)
 Patient would like aspirin 81 MG EC tablet and levothyroxine (SYNTHROID) 137 MCG tablet to be taken off of med list

## 2023-09-09 ENCOUNTER — Ambulatory Visit: Payer: Self-pay | Admitting: Adult Health

## 2023-09-09 LAB — VITAMIN B12: Vitamin B-12: 387 pg/mL (ref 232–1245)

## 2023-09-09 LAB — IRON,TIBC AND FERRITIN PANEL
Ferritin: 62 ng/mL (ref 15–150)
Iron Saturation: 17 % (ref 15–55)
Iron: 61 ug/dL (ref 27–139)
Total Iron Binding Capacity: 367 ug/dL (ref 250–450)
UIBC: 306 ug/dL (ref 118–369)

## 2023-09-09 LAB — TSH: TSH: 5.28 u[IU]/mL — ABNORMAL HIGH (ref 0.450–4.500)

## 2023-09-09 LAB — CBC
Hematocrit: 41.8 % (ref 34.0–46.6)
Hemoglobin: 13.4 g/dL (ref 11.1–15.9)
MCH: 30 pg (ref 26.6–33.0)
MCHC: 32.1 g/dL (ref 31.5–35.7)
MCV: 94 fL (ref 79–97)
Platelets: 212 x10E3/uL (ref 150–450)
RBC: 4.47 x10E6/uL (ref 3.77–5.28)
RDW: 13.6 % (ref 11.7–15.4)
WBC: 7.3 x10E3/uL (ref 3.4–10.8)

## 2023-09-09 LAB — BASIC METABOLIC PANEL WITH GFR
BUN/Creatinine Ratio: 28 (ref 12–28)
BUN: 17 mg/dL (ref 8–27)
CO2: 24 mmol/L (ref 20–29)
Calcium: 9.6 mg/dL (ref 8.7–10.3)
Chloride: 104 mmol/L (ref 96–106)
Creatinine, Ser: 0.61 mg/dL (ref 0.57–1.00)
Glucose: 108 mg/dL — ABNORMAL HIGH (ref 70–99)
Potassium: 4.3 mmol/L (ref 3.5–5.2)
Sodium: 142 mmol/L (ref 134–144)
eGFR: 96 mL/min/1.73 (ref >59)

## 2023-09-09 LAB — VITAMIN D 25 HYDROXY (VIT D DEFICIENCY, FRACTURES): Vit D, 25-Hydroxy: 42.3 ng/mL (ref 30.0–100.0)

## 2023-09-10 NOTE — Progress Notes (Signed)
 cpap Received: Today Rigel Filsinger D, CMA  Zott, Gasper Ona, Tammy; Darrel Boyer New orders have been placed for the above pt, DOB: Aug 09, 2055 Thanks

## 2023-09-28 DIAGNOSIS — M25561 Pain in right knee: Secondary | ICD-10-CM | POA: Diagnosis not present

## 2023-09-28 DIAGNOSIS — M6281 Muscle weakness (generalized): Secondary | ICD-10-CM | POA: Diagnosis not present

## 2023-09-28 DIAGNOSIS — M25571 Pain in right ankle and joints of right foot: Secondary | ICD-10-CM | POA: Diagnosis not present

## 2023-09-28 DIAGNOSIS — M25674 Stiffness of right foot, not elsewhere classified: Secondary | ICD-10-CM | POA: Diagnosis not present

## 2023-09-28 NOTE — Progress Notes (Unsigned)
   Kayla Ileana Collet, PhD, LAT, ATC acting as a scribe for Artist Lloyd, MD.  Kayla Mueller is a 68 y.o. female who presents to [facility] at [facility] today for left arm and bilateral knee pain.  She is recovering from R foot surgery. Started PT last week. Pt reports the L shoulder is very painful and will shoot into the upper arm. Pt also R knee pain that's exacerbated from using her knee scooter in post-op recovery    Pertinent review of systems: No fevers or chills  Relevant historical information: Recent foot surgery   Exam:  BP (!) 140/88   Pulse 82   Ht 5' (1.524 m)   Wt 161 lb (73 kg)   SpO2 98%   BMI 31.44 kg/m  General: Well Developed, well nourished, and in no acute distress.   MSK: Left shoulder normal-appearing Decreased range of motion.    Lab and Radiology Results  Procedure: Real-time Ultrasound Guided Injection of left shoulder glenohumeral joint posterior approach Device: Philips Affiniti 50G/GE Logiq Images permanently stored and available for review in PACS Verbal informed consent obtained.  Discussed risks and benefits of procedure. Warned about infection, bleeding, hyperglycemia damage to structures among others. Patient expresses understanding and agreement Time-out conducted.   Noted no overlying erythema, induration, or other signs of local infection.   Skin prepped in a sterile fashion.   Local anesthesia: Topical Ethyl chloride.   With sterile technique and under real time ultrasound guidance: 40 mg of Kenalog and 2 mL of Marcaine injected into glenohumeral joint. Fluid seen entering the joint capsule.   Completed without difficulty   Pain immediately resolved suggesting accurate placement of the medication.   Advised to call if fevers/chills, erythema, induration, drainage, or persistent bleeding.   Images permanently stored and available for review in the ultrasound unit.  Impression: Technically successful ultrasound  guided injection.       Assessment and Plan: 68 y.o. female with left shoulder pain due to DJD.  Plan for steroid injection.  Additionally patient has right knee pain due to DJD.  Plan to inject the right knee in the near future.  She is done poorly with 2 injections in 1 day in the past.  We are going to space these out by about a week.   Additionally she is having recurrent lumbar radiculopathy.  Plan for repeat epidural steroid injection.  PDMP not reviewed this encounter. Orders Placed This Encounter  Procedures   US  LIMITED JOINT SPACE STRUCTURES LOW RIGHT(NO LINKED CHARGES)    Reason for Exam (SYMPTOM  OR DIAGNOSIS REQUIRED):   right knee pain    Preferred imaging location?:   [facility] DIAG/THERA/INC NEEDLE/CATH/PLC EPI/LUMB/SAC W/IMG    Level and technique per radiology    Standing Status:   Future    Expiration Date:   10/30/2023    Reason for Exam (SYMPTOM  OR DIAGNOSIS REQUIRED):   Low back pain    Preferred Imaging Location?:   [facility]   No orders of the defined types were placed in this encounter.    Discussed warning signs or symptoms. Please see discharge instructions. Patient expresses understanding.   The above documentation has been reviewed and is accurate and complete Artist Lloyd, M.D.

## 2023-09-29 ENCOUNTER — Ambulatory Visit: Admitting: Family Medicine

## 2023-09-29 ENCOUNTER — Other Ambulatory Visit: Payer: Self-pay

## 2023-09-29 VITALS — BP 140/88 | HR 82 | Ht 60.0 in | Wt 161.0 lb

## 2023-09-29 DIAGNOSIS — M5416 Radiculopathy, lumbar region: Secondary | ICD-10-CM

## 2023-09-29 DIAGNOSIS — M25561 Pain in right knee: Secondary | ICD-10-CM

## 2023-09-29 DIAGNOSIS — G8929 Other chronic pain: Secondary | ICD-10-CM

## 2023-09-29 DIAGNOSIS — M25512 Pain in left shoulder: Secondary | ICD-10-CM | POA: Diagnosis not present

## 2023-09-29 NOTE — Patient Instructions (Addendum)
 Thank you for coming in today.  You received an injection today. Seek immediate medical attention if the joint becomes red, extremely painful, or is oozing fluid.  Please call [facility]) at [phone] to schedule your spine injection.

## 2023-09-30 DIAGNOSIS — M6281 Muscle weakness (generalized): Secondary | ICD-10-CM | POA: Diagnosis not present

## 2023-09-30 DIAGNOSIS — M25561 Pain in right knee: Secondary | ICD-10-CM | POA: Diagnosis not present

## 2023-09-30 DIAGNOSIS — M25571 Pain in right ankle and joints of right foot: Secondary | ICD-10-CM | POA: Diagnosis not present

## 2023-09-30 DIAGNOSIS — M25674 Stiffness of right foot, not elsewhere classified: Secondary | ICD-10-CM | POA: Diagnosis not present

## 2023-10-02 DIAGNOSIS — M25561 Pain in right knee: Secondary | ICD-10-CM | POA: Diagnosis not present

## 2023-10-02 DIAGNOSIS — M25571 Pain in right ankle and joints of right foot: Secondary | ICD-10-CM | POA: Diagnosis not present

## 2023-10-02 DIAGNOSIS — M6281 Muscle weakness (generalized): Secondary | ICD-10-CM | POA: Diagnosis not present

## 2023-10-02 DIAGNOSIS — M25674 Stiffness of right foot, not elsewhere classified: Secondary | ICD-10-CM | POA: Diagnosis not present

## 2023-10-06 ENCOUNTER — Ambulatory Visit: Admitting: Family Medicine

## 2023-10-06 DIAGNOSIS — E669 Obesity, unspecified: Secondary | ICD-10-CM | POA: Diagnosis not present

## 2023-10-06 DIAGNOSIS — G4733 Obstructive sleep apnea (adult) (pediatric): Secondary | ICD-10-CM | POA: Diagnosis not present

## 2023-10-06 DIAGNOSIS — R7303 Prediabetes: Secondary | ICD-10-CM | POA: Diagnosis not present

## 2023-10-07 DIAGNOSIS — M25571 Pain in right ankle and joints of right foot: Secondary | ICD-10-CM | POA: Diagnosis not present

## 2023-10-07 DIAGNOSIS — M25561 Pain in right knee: Secondary | ICD-10-CM | POA: Diagnosis not present

## 2023-10-07 DIAGNOSIS — M6281 Muscle weakness (generalized): Secondary | ICD-10-CM | POA: Diagnosis not present

## 2023-10-07 DIAGNOSIS — M25674 Stiffness of right foot, not elsewhere classified: Secondary | ICD-10-CM | POA: Diagnosis not present

## 2023-10-09 DIAGNOSIS — M25674 Stiffness of right foot, not elsewhere classified: Secondary | ICD-10-CM | POA: Diagnosis not present

## 2023-10-09 DIAGNOSIS — M6281 Muscle weakness (generalized): Secondary | ICD-10-CM | POA: Diagnosis not present

## 2023-10-09 DIAGNOSIS — M25561 Pain in right knee: Secondary | ICD-10-CM | POA: Diagnosis not present

## 2023-10-09 DIAGNOSIS — M25571 Pain in right ankle and joints of right foot: Secondary | ICD-10-CM | POA: Diagnosis not present

## 2023-10-10 DIAGNOSIS — M47816 Spondylosis without myelopathy or radiculopathy, lumbar region: Secondary | ICD-10-CM | POA: Diagnosis not present

## 2023-10-12 DIAGNOSIS — M25571 Pain in right ankle and joints of right foot: Secondary | ICD-10-CM | POA: Diagnosis not present

## 2023-10-12 DIAGNOSIS — M25561 Pain in right knee: Secondary | ICD-10-CM | POA: Diagnosis not present

## 2023-10-12 DIAGNOSIS — M25674 Stiffness of right foot, not elsewhere classified: Secondary | ICD-10-CM | POA: Diagnosis not present

## 2023-10-12 DIAGNOSIS — M6281 Muscle weakness (generalized): Secondary | ICD-10-CM | POA: Diagnosis not present

## 2023-10-14 ENCOUNTER — Other Ambulatory Visit

## 2023-10-16 DIAGNOSIS — M25561 Pain in right knee: Secondary | ICD-10-CM | POA: Diagnosis not present

## 2023-10-16 DIAGNOSIS — M6281 Muscle weakness (generalized): Secondary | ICD-10-CM | POA: Diagnosis not present

## 2023-10-16 DIAGNOSIS — M25674 Stiffness of right foot, not elsewhere classified: Secondary | ICD-10-CM | POA: Diagnosis not present

## 2023-10-16 DIAGNOSIS — M25571 Pain in right ankle and joints of right foot: Secondary | ICD-10-CM | POA: Diagnosis not present

## 2023-10-19 ENCOUNTER — Encounter: Payer: Self-pay | Admitting: Internal Medicine

## 2023-10-19 DIAGNOSIS — M6281 Muscle weakness (generalized): Secondary | ICD-10-CM | POA: Diagnosis not present

## 2023-10-19 DIAGNOSIS — M25561 Pain in right knee: Secondary | ICD-10-CM | POA: Diagnosis not present

## 2023-10-19 DIAGNOSIS — M25571 Pain in right ankle and joints of right foot: Secondary | ICD-10-CM | POA: Diagnosis not present

## 2023-10-19 DIAGNOSIS — M25674 Stiffness of right foot, not elsewhere classified: Secondary | ICD-10-CM | POA: Diagnosis not present

## 2023-10-21 DIAGNOSIS — E039 Hypothyroidism, unspecified: Secondary | ICD-10-CM | POA: Diagnosis not present

## 2023-10-21 DIAGNOSIS — M25561 Pain in right knee: Secondary | ICD-10-CM | POA: Diagnosis not present

## 2023-10-21 DIAGNOSIS — M25674 Stiffness of right foot, not elsewhere classified: Secondary | ICD-10-CM | POA: Diagnosis not present

## 2023-10-21 DIAGNOSIS — M6281 Muscle weakness (generalized): Secondary | ICD-10-CM | POA: Diagnosis not present

## 2023-10-21 DIAGNOSIS — M25571 Pain in right ankle and joints of right foot: Secondary | ICD-10-CM | POA: Diagnosis not present

## 2023-10-23 DIAGNOSIS — M25561 Pain in right knee: Secondary | ICD-10-CM | POA: Diagnosis not present

## 2023-10-23 DIAGNOSIS — M25571 Pain in right ankle and joints of right foot: Secondary | ICD-10-CM | POA: Diagnosis not present

## 2023-10-23 DIAGNOSIS — M25674 Stiffness of right foot, not elsewhere classified: Secondary | ICD-10-CM | POA: Diagnosis not present

## 2023-10-23 DIAGNOSIS — M6281 Muscle weakness (generalized): Secondary | ICD-10-CM | POA: Diagnosis not present

## 2023-10-26 DIAGNOSIS — M25571 Pain in right ankle and joints of right foot: Secondary | ICD-10-CM | POA: Diagnosis not present

## 2023-10-26 DIAGNOSIS — M25674 Stiffness of right foot, not elsewhere classified: Secondary | ICD-10-CM | POA: Diagnosis not present

## 2023-10-26 DIAGNOSIS — M6281 Muscle weakness (generalized): Secondary | ICD-10-CM | POA: Diagnosis not present

## 2023-10-26 DIAGNOSIS — M25561 Pain in right knee: Secondary | ICD-10-CM | POA: Diagnosis not present

## 2023-10-28 DIAGNOSIS — M6281 Muscle weakness (generalized): Secondary | ICD-10-CM | POA: Diagnosis not present

## 2023-10-28 DIAGNOSIS — M25561 Pain in right knee: Secondary | ICD-10-CM | POA: Diagnosis not present

## 2023-10-28 DIAGNOSIS — M25571 Pain in right ankle and joints of right foot: Secondary | ICD-10-CM | POA: Diagnosis not present

## 2023-10-28 DIAGNOSIS — M25674 Stiffness of right foot, not elsewhere classified: Secondary | ICD-10-CM | POA: Diagnosis not present

## 2023-10-30 DIAGNOSIS — M25571 Pain in right ankle and joints of right foot: Secondary | ICD-10-CM | POA: Diagnosis not present

## 2023-10-30 DIAGNOSIS — M25561 Pain in right knee: Secondary | ICD-10-CM | POA: Diagnosis not present

## 2023-10-30 DIAGNOSIS — M6281 Muscle weakness (generalized): Secondary | ICD-10-CM | POA: Diagnosis not present

## 2023-10-30 DIAGNOSIS — M25674 Stiffness of right foot, not elsewhere classified: Secondary | ICD-10-CM | POA: Diagnosis not present

## 2023-11-03 DIAGNOSIS — M6281 Muscle weakness (generalized): Secondary | ICD-10-CM | POA: Diagnosis not present

## 2023-11-03 DIAGNOSIS — M25674 Stiffness of right foot, not elsewhere classified: Secondary | ICD-10-CM | POA: Diagnosis not present

## 2023-11-03 DIAGNOSIS — M25571 Pain in right ankle and joints of right foot: Secondary | ICD-10-CM | POA: Diagnosis not present

## 2023-11-03 DIAGNOSIS — M25561 Pain in right knee: Secondary | ICD-10-CM | POA: Diagnosis not present

## 2023-11-09 DIAGNOSIS — M25561 Pain in right knee: Secondary | ICD-10-CM | POA: Diagnosis not present

## 2023-11-09 DIAGNOSIS — M25571 Pain in right ankle and joints of right foot: Secondary | ICD-10-CM | POA: Diagnosis not present

## 2023-11-09 DIAGNOSIS — M25674 Stiffness of right foot, not elsewhere classified: Secondary | ICD-10-CM | POA: Diagnosis not present

## 2023-11-09 DIAGNOSIS — M6281 Muscle weakness (generalized): Secondary | ICD-10-CM | POA: Diagnosis not present

## 2023-11-11 DIAGNOSIS — M25571 Pain in right ankle and joints of right foot: Secondary | ICD-10-CM | POA: Diagnosis not present

## 2023-11-11 DIAGNOSIS — M6281 Muscle weakness (generalized): Secondary | ICD-10-CM | POA: Diagnosis not present

## 2023-11-11 DIAGNOSIS — M25561 Pain in right knee: Secondary | ICD-10-CM | POA: Diagnosis not present

## 2023-11-11 DIAGNOSIS — M25674 Stiffness of right foot, not elsewhere classified: Secondary | ICD-10-CM | POA: Diagnosis not present

## 2023-11-16 DIAGNOSIS — M25571 Pain in right ankle and joints of right foot: Secondary | ICD-10-CM | POA: Diagnosis not present

## 2023-11-16 DIAGNOSIS — M25674 Stiffness of right foot, not elsewhere classified: Secondary | ICD-10-CM | POA: Diagnosis not present

## 2023-11-16 DIAGNOSIS — M6281 Muscle weakness (generalized): Secondary | ICD-10-CM | POA: Diagnosis not present

## 2023-11-16 DIAGNOSIS — M25561 Pain in right knee: Secondary | ICD-10-CM | POA: Diagnosis not present

## 2023-11-18 DIAGNOSIS — M25674 Stiffness of right foot, not elsewhere classified: Secondary | ICD-10-CM | POA: Diagnosis not present

## 2023-11-18 DIAGNOSIS — M25561 Pain in right knee: Secondary | ICD-10-CM | POA: Diagnosis not present

## 2023-11-18 DIAGNOSIS — M25571 Pain in right ankle and joints of right foot: Secondary | ICD-10-CM | POA: Diagnosis not present

## 2023-11-18 DIAGNOSIS — M6281 Muscle weakness (generalized): Secondary | ICD-10-CM | POA: Diagnosis not present

## 2023-11-23 DIAGNOSIS — M19071 Primary osteoarthritis, right ankle and foot: Secondary | ICD-10-CM | POA: Diagnosis not present

## 2023-11-23 DIAGNOSIS — Q6672 Congenital pes cavus, left foot: Secondary | ICD-10-CM | POA: Diagnosis not present

## 2023-11-23 DIAGNOSIS — Z4789 Encounter for other orthopedic aftercare: Secondary | ICD-10-CM | POA: Diagnosis not present

## 2023-11-23 DIAGNOSIS — M79671 Pain in right foot: Secondary | ICD-10-CM | POA: Diagnosis not present

## 2023-11-23 DIAGNOSIS — Q6671 Congenital pes cavus, right foot: Secondary | ICD-10-CM | POA: Diagnosis not present

## 2023-11-23 DIAGNOSIS — M2021 Hallux rigidus, right foot: Secondary | ICD-10-CM | POA: Diagnosis not present

## 2023-11-24 DIAGNOSIS — M6281 Muscle weakness (generalized): Secondary | ICD-10-CM | POA: Diagnosis not present

## 2023-11-24 DIAGNOSIS — M25571 Pain in right ankle and joints of right foot: Secondary | ICD-10-CM | POA: Diagnosis not present

## 2023-11-24 DIAGNOSIS — M25561 Pain in right knee: Secondary | ICD-10-CM | POA: Diagnosis not present

## 2023-11-24 DIAGNOSIS — M25674 Stiffness of right foot, not elsewhere classified: Secondary | ICD-10-CM | POA: Diagnosis not present

## 2023-11-25 ENCOUNTER — Encounter: Payer: Self-pay | Admitting: Family Medicine

## 2023-11-25 ENCOUNTER — Telehealth: Payer: Self-pay

## 2023-11-25 ENCOUNTER — Ambulatory Visit: Admitting: Family Medicine

## 2023-11-25 ENCOUNTER — Other Ambulatory Visit: Payer: Self-pay

## 2023-11-25 VITALS — BP 126/84 | HR 79 | Ht 60.0 in | Wt 158.0 lb

## 2023-11-25 DIAGNOSIS — M25561 Pain in right knee: Secondary | ICD-10-CM

## 2023-11-25 DIAGNOSIS — M25512 Pain in left shoulder: Secondary | ICD-10-CM

## 2023-11-25 DIAGNOSIS — M25511 Pain in right shoulder: Secondary | ICD-10-CM | POA: Diagnosis not present

## 2023-11-25 DIAGNOSIS — G8929 Other chronic pain: Secondary | ICD-10-CM | POA: Diagnosis not present

## 2023-11-25 DIAGNOSIS — M1711 Unilateral primary osteoarthritis, right knee: Secondary | ICD-10-CM

## 2023-11-25 MED ORDER — TRAMADOL HCL 50 MG PO TABS: 50.0000 mg | ORAL_TABLET | Freq: Three times a day (TID) | ORAL | 0 refills | Status: AC | PRN

## 2023-11-25 NOTE — Telephone Encounter (Signed)
 Ran benefits for monovisc right knee Case [id number]

## 2023-11-25 NOTE — Patient Instructions (Addendum)
 Thank you for coming in today.   You received an injection today. Seek immediate medical attention if the joint becomes red, extremely painful, or is oozing fluid.   I've referred you to Orthopedic Surgery (Dr. Genelle) for your shoulders.  Let us  know if you don't hear from them in one week.   We will work to authorize gel shots for your knee. My office will call you once we get approval from your insurance company.

## 2023-11-25 NOTE — Progress Notes (Signed)
 I, Leotis Batter, CMA acting as a scribe for Artist Lloyd, MD.  Mahina Salatino is a 68 y.o. female who presents to [facility] at [facility] today for R knee pain. Pt was last seen by Dr. Lloyd on 09/29/23 for her shoulder.   Today, pt c/o exacerbation of her R knee pain x 4 months, sx started after using knee scooter after foot surgery. Has been out of the walking boot x 2 months but continues to have knee pain. Locates pain to medial aspect of the knee. Denies visible swelling. Mechanical sx present. Has been in PT for 6 weeks for LE pain. Sx worse with ambulation. Has tried rest and PT.    Also c/o right shoulder pain.   Pertinent review of systems: No fevers or chills  Relevant historical information: Recent foot surgery   Exam:  BP 126/84   Pulse 79   Ht 5' (1.524 m)   Wt 158 lb (71.7 kg)   SpO2 98%   BMI 30.86 kg/m  General: Well Developed, well nourished, and in no acute distress.   MSK: Right shoulder decreased range of motion.  Pain with abduction.  Right knee mild effusion decreased range of motion.  Palpation.    Lab and Radiology Results  Procedure: Real-time Ultrasound Guided Injection of right shoulder glenohumeral joint posterior approach Device: Philips Affiniti 50G/GE Logiq Images permanently stored and available for review in PACS Verbal informed consent obtained.  Discussed risks and benefits of procedure. Warned about infection, bleeding, hyperglycemia damage to structures among others. Patient expresses understanding and agreement Time-out conducted.   Noted no overlying erythema, induration, or other signs of local infection.   Skin prepped in a sterile fashion.   Local anesthesia: Topical Ethyl chloride.   With sterile technique and under real time ultrasound guidance: 40 mg of Depo-Medrol  and 2 mL of Marcaine injected into shoulder joint. Fluid seen entering the joint capsule.   Completed without difficulty   Pain immediately  resolved suggesting accurate placement of the medication.   Advised to call if fevers/chills, erythema, induration, drainage, or persistent bleeding.   Images permanently stored and available for review in the ultrasound unit.  Impression: Technically successful ultrasound guided injection.         Assessment and Plan: 68 y.o. female with bilateral shoulder pain due to DJD.  Plan for right glenohumeral injection today.  Plan to refer to orthopedic surgery to discuss shoulder surgery options including total shoulder replacement.  Right knee pain due to exacerbation of DJD.  Will work on authorization for hyaluronic acid injections.   PDMP not reviewed this encounter. Orders Placed This Encounter  Procedures   US  LIMITED JOINT SPACE STRUCTURES LOW RIGHT(NO LINKED CHARGES)    Reason for Exam (SYMPTOM  OR DIAGNOSIS REQUIRED):   right knee pain    Preferred imaging location?:   [place] referral to Orthopedic Surgery    Referral Priority:   Routine    Referral Type:   Surgical    Referral Reason:   Specialty Services Required    Referred to Provider:   Genelle Standing, MD    Requested Specialty:   Orthopedic Surgery    Number of Visits Requested:   1   No orders of the defined types were placed in this encounter.    Discussed warning signs or symptoms. Please see discharge instructions. Patient expresses understanding.   The above documentation has been reviewed and is accurate and complete Cindi Ghazarian  Joane, M.D.

## 2023-11-25 NOTE — Telephone Encounter (Signed)
 Auth gel shots, RIGHT knee

## 2023-11-26 DIAGNOSIS — M25674 Stiffness of right foot, not elsewhere classified: Secondary | ICD-10-CM | POA: Diagnosis not present

## 2023-11-26 DIAGNOSIS — M25561 Pain in right knee: Secondary | ICD-10-CM | POA: Diagnosis not present

## 2023-11-26 DIAGNOSIS — M6281 Muscle weakness (generalized): Secondary | ICD-10-CM | POA: Diagnosis not present

## 2023-11-26 DIAGNOSIS — M25571 Pain in right ankle and joints of right foot: Secondary | ICD-10-CM | POA: Diagnosis not present

## 2023-11-27 NOTE — Telephone Encounter (Signed)
 Accidentally ran the wrong injection re-ran for Orthovisc case Id Case [id number]

## 2023-11-30 DIAGNOSIS — M25561 Pain in right knee: Secondary | ICD-10-CM | POA: Diagnosis not present

## 2023-11-30 DIAGNOSIS — M25674 Stiffness of right foot, not elsewhere classified: Secondary | ICD-10-CM | POA: Diagnosis not present

## 2023-11-30 DIAGNOSIS — M6281 Muscle weakness (generalized): Secondary | ICD-10-CM | POA: Diagnosis not present

## 2023-11-30 DIAGNOSIS — M25571 Pain in right ankle and joints of right foot: Secondary | ICD-10-CM | POA: Diagnosis not present

## 2023-11-30 NOTE — Telephone Encounter (Signed)
 Can you schedule patient when medication is stocked   Synvisc series authorized for right knee Auth/Case# [id number] 11/27/23-11/26/24

## 2023-12-01 NOTE — Telephone Encounter (Signed)
 Scheduled

## 2023-12-02 DIAGNOSIS — M6281 Muscle weakness (generalized): Secondary | ICD-10-CM | POA: Diagnosis not present

## 2023-12-02 DIAGNOSIS — M25561 Pain in right knee: Secondary | ICD-10-CM | POA: Diagnosis not present

## 2023-12-02 DIAGNOSIS — M25674 Stiffness of right foot, not elsewhere classified: Secondary | ICD-10-CM | POA: Diagnosis not present

## 2023-12-02 DIAGNOSIS — M25571 Pain in right ankle and joints of right foot: Secondary | ICD-10-CM | POA: Diagnosis not present

## 2023-12-07 ENCOUNTER — Encounter: Payer: Self-pay | Admitting: Radiology

## 2023-12-09 ENCOUNTER — Ambulatory Visit: Admitting: Family Medicine

## 2023-12-09 ENCOUNTER — Other Ambulatory Visit: Payer: Self-pay

## 2023-12-09 VITALS — BP 126/84 | HR 79 | Ht 60.0 in | Wt 158.0 lb

## 2023-12-09 DIAGNOSIS — M25561 Pain in right knee: Secondary | ICD-10-CM

## 2023-12-09 DIAGNOSIS — M1711 Unilateral primary osteoarthritis, right knee: Secondary | ICD-10-CM

## 2023-12-09 DIAGNOSIS — G8929 Other chronic pain: Secondary | ICD-10-CM | POA: Diagnosis not present

## 2023-12-09 DIAGNOSIS — M545 Low back pain, unspecified: Secondary | ICD-10-CM

## 2023-12-09 MED ORDER — TIZANIDINE HCL 2 MG PO TABS: 2.0000 mg | ORAL_TABLET | Freq: Three times a day (TID) | ORAL | 1 refills | Status: DC | PRN

## 2023-12-09 MED ORDER — HYLAN G-F 20 16 MG/2ML IX SOSY
16.0000 mg | PREFILLED_SYRINGE | Freq: Once | INTRA_ARTICULAR | Status: AC
  Administered 2023-12-09: 16 mg via INTRA_ARTICULAR

## 2023-12-09 NOTE — Patient Instructions (Signed)
Thank you for coming in today.   You received an injection today. Seek immediate medical attention if the joint becomes red, extremely painful, or is oozing fluid.   Schedule the 2nd gel shot for next week and the 3rd for the week after that.  

## 2023-12-09 NOTE — Progress Notes (Signed)
    Kayla Mueller is a 68 y.o. female who presents to [facility] at [facility] today to start the Synvisc series in her R knee. Pt was last seen by Dr. Joane on 10/22 and was prescribed tramadol  and referred to orthopedic surgery.  Today, pt reports she is wanting to start the gel series for her knee. She also c/o LBP worsening since yesterday. Pain was exacerbation by her trying to clean out a closet and doing a lot of heavy lifting.   Pertinent review of systems: No fevers or chills  Relevant historical information: Sleep apnea   Exam:  BP 126/84   Pulse 79   Ht 5' (1.524 m)   Wt 158 lb (71.7 kg)   SpO2 98%   BMI 30.86 kg/m  General: Well Developed, well nourished, and in no acute distress.   MSK: L-spine nontender to palpation midline decreased lumbar motion lower extremity strength is intact.  Right knee minimal effusion.    Lab and Radiology Results  Kayla Mueller presents to clinic today for Synvisc injection right knee 1/3 Procedure: Real-time Ultrasound Guided Injection of right knee joint superior lateral patellar space Device: Philips Affiniti 50G/GE Logiq Images permanently stored and available for review in PACS Verbal informed consent obtained.  Discussed risks and benefits of procedure. Warned about infection, bleeding, damage to structures among others. Patient expresses understanding and agreement Time-out conducted.   Noted no overlying erythema, induration, or other signs of local infection.   Skin prepped in a sterile fashion.   Local anesthesia: Topical Ethyl chloride.   With sterile technique and under real time ultrasound guidance: Synvisc 2 mL injected into knee joint. Fluid seen entering the joint capsule.   Completed without difficulty   Advised to call if fevers/chills, erythema, induration, drainage, or persistent bleeding.   Images permanently stored and available for review in the ultrasound unit.  Impression:  Technically successful ultrasound guided injection.     Assessment and Plan: 68 y.o. female with right knee pain due to DJD.  Plan for Synvisc injection today.  Return in 1 week for Synvisc injection right knee 2/3.  For back pain plan for heating pad and tizanidine and continued activity as tolerated.   PDMP not reviewed this encounter. Orders Placed This Encounter  Procedures   US  LIMITED JOINT SPACE STRUCTURES LOW RIGHT(NO LINKED CHARGES)    Reason for Exam (SYMPTOM  OR DIAGNOSIS REQUIRED):   right knee OA    Preferred imaging location?:   [facility] ordered this encounter  Medications   hylan (SYNVISC) intra-articular injection 16 mg   tiZANidine (ZANAFLEX) 2 MG tablet    Sig: Take 1-2 tablets (2-4 mg total) by mouth every 8 (eight) hours as needed.    Dispense:  60 tablet    Refill:  1     Discussed warning signs or symptoms. Please see discharge instructions. Patient expresses understanding.   The above documentation has been reviewed and is accurate and complete Artist Joane, M.D.

## 2023-12-16 ENCOUNTER — Ambulatory Visit: Admitting: Family Medicine

## 2023-12-16 ENCOUNTER — Other Ambulatory Visit: Payer: Self-pay

## 2023-12-16 DIAGNOSIS — M1711 Unilateral primary osteoarthritis, right knee: Secondary | ICD-10-CM | POA: Diagnosis not present

## 2023-12-16 DIAGNOSIS — M25561 Pain in right knee: Secondary | ICD-10-CM | POA: Diagnosis not present

## 2023-12-16 DIAGNOSIS — G8929 Other chronic pain: Secondary | ICD-10-CM | POA: Diagnosis not present

## 2023-12-16 MED ORDER — HYLAN G-F 20 16 MG/2ML IX SOSY
16.0000 mg | PREFILLED_SYRINGE | Freq: Once | INTRA_ARTICULAR | Status: AC
  Administered 2023-12-16: 16 mg via INTRA_ARTICULAR

## 2023-12-16 NOTE — Patient Instructions (Signed)
 Thank you for coming in today.   You received an injection today. Seek immediate medical attention if the joint becomes red, extremely painful, or is oozing fluid.   See you back in 1 week for Synvisc #3

## 2023-12-16 NOTE — Progress Notes (Signed)
 Kayla Mueller presents to clinic today for Synvisc injection right knee 2/3 Procedure: Real-time Ultrasound Guided Injection of right knee joint superior lateral patellar space Device: Philips Affiniti 50G/GE Logiq Images permanently stored and available for review in PACS Verbal informed consent obtained.  Discussed risks and benefits of procedure. Warned about infection, bleeding, damage to structures among others. Patient expresses understanding and agreement Time-out conducted.   Noted no overlying erythema, induration, or other signs of local infection.   Skin prepped in a sterile fashion.   Local anesthesia: Topical Ethyl chloride.   With sterile technique and under real time ultrasound guidance: Synvisc 2 mL injected into knee joint. Fluid seen entering the joint capsule.   Completed without difficulty   Advised to call if fevers/chills, erythema, induration, drainage, or persistent bleeding.   Images permanently stored and available for review in the ultrasound unit.  Impression: Technically successful ultrasound guided injection. Lot number: [id number]

## 2023-12-22 DIAGNOSIS — H9313 Tinnitus, bilateral: Secondary | ICD-10-CM | POA: Diagnosis not present

## 2023-12-22 DIAGNOSIS — N3281 Overactive bladder: Secondary | ICD-10-CM | POA: Diagnosis not present

## 2023-12-22 DIAGNOSIS — R350 Frequency of micturition: Secondary | ICD-10-CM | POA: Diagnosis not present

## 2023-12-23 ENCOUNTER — Ambulatory Visit: Admitting: Family Medicine

## 2023-12-23 ENCOUNTER — Other Ambulatory Visit: Payer: Self-pay

## 2023-12-23 DIAGNOSIS — M1711 Unilateral primary osteoarthritis, right knee: Secondary | ICD-10-CM

## 2023-12-23 MED ORDER — TIZANIDINE HCL 2 MG PO TABS: 2.0000 mg | ORAL_TABLET | Freq: Three times a day (TID) | ORAL | 6 refills | Status: AC | PRN

## 2023-12-23 MED ORDER — HYLAN G-F 20 16 MG/2ML IX SOSY
16.0000 mg | PREFILLED_SYRINGE | Freq: Once | INTRA_ARTICULAR | Status: AC
  Administered 2023-12-23: 16 mg via INTRA_ARTICULAR

## 2023-12-23 NOTE — Progress Notes (Signed)
 Kayla Mueller presents to clinic today for Synvisc injection right knee 3/3 Procedure: Real-time Ultrasound Guided Injection of right knee joint superior lateral patellar space Device: Philips Affiniti 50G/GE Logiq Images permanently stored and available for review in PACS Verbal informed consent obtained.  Discussed risks and benefits of procedure. Warned about infection, bleeding, damage to structures among others. Patient expresses understanding and agreement Time-out conducted.   Noted no overlying erythema, induration, or other signs of local infection.   Skin prepped in a sterile fashion.   Local anesthesia: Topical Ethyl chloride.   With sterile technique and under real time ultrasound guidance: Synvisc 2 mL injected into knee joint. Fluid seen entering the joint capsule.   Completed without difficulty   Advised to call if fevers/chills, erythema, induration, drainage, or persistent bleeding.   Images permanently stored and available for review in the ultrasound unit.  Impression: Technically successful ultrasound guided injection. Lot number: [id number]

## 2023-12-23 NOTE — Patient Instructions (Signed)
 Thank you for coming in today.   You received an injection today. Seek immediate medical attention if the joint becomes red, extremely painful, or is oozing fluid.   Refill of your muscle relaxer was sent to your pharmacy.  Check back as needed

## 2023-12-28 ENCOUNTER — Telehealth: Payer: Self-pay | Admitting: Family Medicine

## 2023-12-28 DIAGNOSIS — G894 Chronic pain syndrome: Secondary | ICD-10-CM

## 2023-12-28 NOTE — Telephone Encounter (Signed)
 Patient called asking if Dr Joane would be able to refer her to a new spine doctor for ablations? She will be changing insurance and her current doctor at [facility] will no longer be in network.  Please advise.

## 2023-12-29 ENCOUNTER — Encounter: Payer: Self-pay | Admitting: Family Medicine

## 2023-12-29 NOTE — Telephone Encounter (Signed)
 Referred to Dr. Darlis at [facility] neurosurgery here in [place].  If that location will not work there are many other locations in town that could work.

## 2023-12-30 ENCOUNTER — Ambulatory Visit (HOSPITAL_BASED_OUTPATIENT_CLINIC_OR_DEPARTMENT_OTHER): Admitting: Orthopaedic Surgery

## 2023-12-30 DIAGNOSIS — F439 Reaction to severe stress, unspecified: Secondary | ICD-10-CM | POA: Diagnosis not present

## 2023-12-30 DIAGNOSIS — F339 Major depressive disorder, recurrent, unspecified: Secondary | ICD-10-CM | POA: Diagnosis not present

## 2023-12-30 DIAGNOSIS — E039 Hypothyroidism, unspecified: Secondary | ICD-10-CM | POA: Diagnosis not present

## 2023-12-30 DIAGNOSIS — I2510 Atherosclerotic heart disease of native coronary artery without angina pectoris: Secondary | ICD-10-CM | POA: Diagnosis not present

## 2023-12-30 DIAGNOSIS — G4700 Insomnia, unspecified: Secondary | ICD-10-CM | POA: Diagnosis not present

## 2023-12-30 DIAGNOSIS — R7303 Prediabetes: Secondary | ICD-10-CM | POA: Diagnosis not present

## 2024-01-13 ENCOUNTER — Ambulatory Visit (INDEPENDENT_AMBULATORY_CARE_PROVIDER_SITE_OTHER): Admitting: Orthopaedic Surgery

## 2024-01-13 ENCOUNTER — Ambulatory Visit (HOSPITAL_BASED_OUTPATIENT_CLINIC_OR_DEPARTMENT_OTHER): Payer: Self-pay | Admitting: Orthopaedic Surgery

## 2024-01-13 ENCOUNTER — Other Ambulatory Visit (HOSPITAL_BASED_OUTPATIENT_CLINIC_OR_DEPARTMENT_OTHER): Payer: Self-pay

## 2024-01-13 DIAGNOSIS — M19012 Primary osteoarthritis, left shoulder: Secondary | ICD-10-CM

## 2024-01-13 MED ORDER — IBUPROFEN 800 MG PO TABS
800.0000 mg | ORAL_TABLET | Freq: Three times a day (TID) | ORAL | 0 refills | Status: AC
  Filled 2024-01-13: qty 30, 10d supply, fill #0

## 2024-01-13 MED ORDER — ACETAMINOPHEN 500 MG PO TABS
500.0000 mg | ORAL_TABLET | Freq: Three times a day (TID) | ORAL | 0 refills | Status: AC
  Filled 2024-01-13: qty 30, 10d supply, fill #0

## 2024-01-13 MED ORDER — ASPIRIN 325 MG PO TBEC
325.0000 mg | DELAYED_RELEASE_TABLET | Freq: Every day | ORAL | 0 refills | Status: AC
  Filled 2024-01-13: qty 14, 14d supply, fill #0

## 2024-01-13 MED ORDER — OXYCODONE HCL 5 MG PO TABS
5.0000 mg | ORAL_TABLET | ORAL | 0 refills | Status: AC | PRN
  Filled 2024-01-13: qty 30, 5d supply, fill #0

## 2024-01-13 NOTE — Addendum Note (Signed)
 Addended by: WOLFGANG CONLEY HERO on: 01/13/2024 11:38 AM   Modules accepted: Orders

## 2024-01-13 NOTE — Progress Notes (Signed)
 Chief Complaint: Bilateral shoulder pain     History of Present Illness:   01/13/2024: Shai today presents for predominantly the left shoulder.  She has been still having some symptoms deep in the joint.  She did recently have an injection with Dr. Margart which has given her excellent right shoulder relief  Tanaysha Harlan is a 68 y.o. female right dominant female presents with bilateral shoulder pain which has been ongoing now for many years.  She states that over the course of essentially the last decade she has been getting injections into both shoulders every 2 to 3 months.  He is now last only 2 months at the longest.  She does enjoy being active but is experiencing pain with any type of overhead activity.  She most recently had injections with Dr. Joane 2 weeks prior for which she got good relief from the right but minimal relief on the left.  Her range of motion was able to improve although this is again painful and limited.  She has trialed physical therapy without significant relief    Surgical History:   None  PMH/PSH/Family History/Social History/Meds/Allergies:    Past Medical History:  Diagnosis Date   Allergic rhinitis    Allergy    seasonal   Aortic atherosclerosis    Arthritis    Atherosclerotic heart disease of native coronary artery without angina pectoris    Breast cancer (HCC)    right   Cataract    Depression    GERD (gastroesophageal reflux disease)    Hyperlipidemia    Hypothyroid    Osteopenia    right hip   Sleep apnea    uses CPAP nightly   Past Surgical History:  Procedure Laterality Date   bilateral arthroscopic knee surgery     BREAST LUMPECTOMY Right    about 12 years ago   CESAREAN SECTION     x2   TONSILLECTOMY     as a child   Social History   Socioeconomic History   Marital status: Widowed    Spouse name: Not on file   Number of children: 2   Years of education: Not on file   Highest  education level: Not on file  Occupational History   Occupation: retired  Tobacco Use   Smoking status: Never   Smokeless tobacco: Never  Vaping Use   Vaping status: Never Used  Substance and Sexual Activity   Alcohol use: Yes    Comment: social   Drug use: Never   Sexual activity: Not on file  Other Topics Concern   Not on file  Social History Narrative   Caffeine: 1-2 cups/day   Social Drivers of Corporate Investment Banker Strain: Low Risk  (04/09/2023)   Received from [facility]   Overall Financial Resource Strain (CARDIA)    Difficulty of Paying Living Expenses: Not hard at all  Food Insecurity: No Food Insecurity (04/09/2023)   Received from [facility]   Hunger Vital Sign    Within the past 12 months, you worried that your food would run out before you got the money to buy more.: Never true    Within the past 12 months, the food you bought just didn't last and you didn't have money to get more.: Never true  Transportation Needs:  No Transportation Needs (04/09/2023)   Received from [facility] - Transportation    In the past 12 months, has lack of transportation kept you from medical appointments or from getting medications?: No    Lack of Transportation (Non-Medical): No  Physical Activity: Insufficiently Active (09/02/2022)   Received from [facility]   Exercise Vital Sign    On average, how many days per week do you engage in moderate to strenuous exercise (like a brisk walk)?: 3 days    On average, how many minutes do you engage in exercise at this level?: 40 min  Stress: No Stress Concern Present (09/02/2022)   Received from [facility] - Occupational Stress Questionnaire    Feeling of Stress : Only a little  Social Connections: Socially Integrated (09/02/2022)   Received from [employer]   Social Network    How would you rate your social network (family,  work, friends)?: Good participation with social networks   Family History  Problem Relation Age of Onset   Stroke Mother    Stroke Father    Sleep apnea Sister    Neuropathy Half-Sibling    Liver disease Neg Hx    Esophageal cancer Neg Hx    Colon cancer Neg Hx    Rectal cancer Neg Hx    Stomach cancer Neg Hx    No Known Allergies Current Outpatient Medications  Medication Sig Dispense Refill   acetaminophen  (TYLENOL ) 500 MG tablet Take 1 tablet (500 mg total) by mouth every 8 (eight) hours for 10 days. 30 tablet 0   aspirin  EC 325 MG tablet Take 1 tablet (325 mg total) by mouth daily. 14 tablet 0   ibuprofen  (ADVIL ) 800 MG tablet Take 1 tablet (800 mg total) by mouth every 8 (eight) hours for 10 days. Please take with food, please alternate with acetaminophen  30 tablet 0   oxyCODONE  (ROXICODONE ) 5 MG immediate release tablet Take 1 tablet (5 mg total) by mouth every 4 (four) hours as needed for severe pain (pain score 7-10) or breakthrough pain. 30 tablet 0   aspirin  EC 325 MG tablet Take 325 mg by mouth daily.     DULoxetine  (CYMBALTA ) 20 MG capsule Take 1 capsule by mouth once daily 30 capsule 0   fluticasone (FLONASE) 50 MCG/ACT nasal spray Place 1 spray into both nostrils daily as needed for allergies.     Multiple Vitamin (MULTIVITAMIN) capsule Take 1 capsule by mouth once a week.     pantoprazole  (PROTONIX ) 40 MG tablet Take 1 tablet (40 mg total) by mouth daily. 90 tablet 3   rosuvastatin (CRESTOR) 20 MG tablet Take 20 mg by mouth daily.     SYNTHROID 88 MCG tablet Take 88 mcg by mouth every morning.     tiZANidine  (ZANAFLEX ) 2 MG tablet Take 1-2 tablets (2-4 mg total) by mouth every 8 (eight) hours as needed. 60 tablet 6   traMADol  (ULTRAM ) 50 MG tablet Take 1 tablet (50 mg total) by mouth every 8 (eight) hours as needed for severe pain (pain score 7-10). 15 tablet 0   No current facility-administered medications for this visit.   No results found.  Review of Systems:   A  ROS was performed including pertinent positives and negatives as documented in the HPI.  Physical Exam :   Constitutional: NAD and appears stated age Neurological: Alert and oriented Psych: Appropriate affect and cooperative There were no vitals taken for  this visit.   Comprehensive Musculoskeletal Exam:    Musculoskeletal Exam    Inspection Right Left  Skin No atrophy or winging No atrophy or winging  Palpation    Tenderness Glenohumeral Glenohumeral  Range of Motion    Flexion (passive) 150 150  Flexion (active) 150 150  Abduction 140 140  ER at the side 45 45  Can reach behind back to L3 L3  Strength     4/5 5/5  Special Tests    Pseudoparalytic No No  Neurologic    Fires PIN, radial, median, ulnar, musculocutaneous, axillary, suprascapular, long thoracic, and spinal accessory innervated muscles. No abnormal sensibility  Vascular/Lymphatic    Radial Pulse 2+ 2+  Cervical Exam    Patient has symmetric cervical range of motion with negative Spurling's test.  Special Test:      Imaging:   Xray (3 view right shoulder, 3 views left shoulder): Moderate glenohumeral osteoarthritis on both sides with a large loose body in the left shoulder  I personally reviewed and interpreted the radiographs.   Assessment:   68 y.o. 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  She is now getting relief only approximately for up to 2 months from injections.  Given the fact that she has trialed this and PT I did discuss shoulder arthroplasty with her.  We did specifically discuss that I do believe she would be candidate for right reverse shoulder arthroplasty.  She is hopeful to defer this as she is getting good relief from her injection.  She is asking predominately about the left.  On the side I do believe she would be a candidate for anatomic shoulder arthroplasty as her rotator cuff does appear to be intact on the CT scan and she overall has excellent motion which she  would like to maintain.  I did discuss wrist limitations associated with as well as associate recovery.  After discussion she would like to proceed Plan :    - Plan for left shoulder anatomic arthroplasty   After a lengthy discussion of treatment options, including risks, benefits, alternatives, complications of surgical and nonsurgical conservative options, the patient elected surgical repair.   The patient  is aware of the material risks  and complications including, but not limited to injury to adjacent structures, neurovascular injury, infection, numbness, bleeding, implant failure, thermal burns, stiffness, persistent pain, failure to heal, disease transmission from allograft, need for further surgery, dislocation, anesthetic risks, blood clots, risks of death,and others. The probabilities of surgical success and failure discussed with patient given their particular co-morbidities.The time and nature of expected rehabilitation and recovery was discussed.The patient's questions were all answered preoperatively.  No barriers to understanding were noted. I explained the natural history of the disease process and Rx rationale.  I explained to the patient what I considered to be reasonable expectations given their personal situation.  The final treatment plan was arrived at through a shared patient decision making process model.      I personally saw and evaluated the patient, and participated in the management and treatment plan.  Elspeth Parker, MD Attending Physician, Orthopedic Surgery  This document was dictated using Dragon voice recognition software. A reasonable attempt at proof reading has been made to minimize errors.

## 2024-01-21 DIAGNOSIS — E079 Disorder of thyroid, unspecified: Secondary | ICD-10-CM | POA: Diagnosis not present

## 2024-01-21 DIAGNOSIS — E785 Hyperlipidemia, unspecified: Secondary | ICD-10-CM | POA: Diagnosis not present

## 2024-01-21 DIAGNOSIS — Z79890 Hormone replacement therapy (postmenopausal): Secondary | ICD-10-CM | POA: Diagnosis not present

## 2024-01-21 DIAGNOSIS — S8001XA Contusion of right knee, initial encounter: Secondary | ICD-10-CM | POA: Diagnosis not present

## 2024-01-21 DIAGNOSIS — K219 Gastro-esophageal reflux disease without esophagitis: Secondary | ICD-10-CM | POA: Diagnosis not present

## 2024-01-21 DIAGNOSIS — S60212A Contusion of left wrist, initial encounter: Secondary | ICD-10-CM | POA: Diagnosis not present

## 2024-01-21 DIAGNOSIS — Z79899 Other long term (current) drug therapy: Secondary | ICD-10-CM | POA: Diagnosis not present

## 2024-01-21 DIAGNOSIS — W01198A Fall on same level from slipping, tripping and stumbling with subsequent striking against other object, initial encounter: Secondary | ICD-10-CM | POA: Diagnosis not present

## 2024-01-21 DIAGNOSIS — T148XXA Other injury of unspecified body region, initial encounter: Secondary | ICD-10-CM | POA: Diagnosis not present

## 2024-01-21 DIAGNOSIS — M25532 Pain in left wrist: Secondary | ICD-10-CM | POA: Diagnosis not present

## 2024-01-21 DIAGNOSIS — S0181XA Laceration without foreign body of other part of head, initial encounter: Secondary | ICD-10-CM | POA: Diagnosis not present

## 2024-01-21 DIAGNOSIS — Z7982 Long term (current) use of aspirin: Secondary | ICD-10-CM | POA: Diagnosis not present

## 2024-02-16 ENCOUNTER — Telehealth (HOSPITAL_BASED_OUTPATIENT_CLINIC_OR_DEPARTMENT_OTHER): Payer: Self-pay | Admitting: Orthopaedic Surgery

## 2024-02-16 NOTE — Telephone Encounter (Signed)
 Patient would like to go to Physical therapy at [facility] in [place] after her surgery

## 2024-02-17 ENCOUNTER — Other Ambulatory Visit (HOSPITAL_BASED_OUTPATIENT_CLINIC_OR_DEPARTMENT_OTHER): Payer: Self-pay | Admitting: Orthopaedic Surgery

## 2024-02-17 DIAGNOSIS — M19011 Primary osteoarthritis, right shoulder: Secondary | ICD-10-CM

## 2024-02-17 DIAGNOSIS — M19012 Primary osteoarthritis, left shoulder: Secondary | ICD-10-CM

## 2024-02-17 NOTE — Telephone Encounter (Signed)
 Order placed [facility]

## 2024-02-26 ENCOUNTER — Telehealth: Payer: Self-pay | Admitting: Orthopaedic Surgery

## 2024-02-26 NOTE — Telephone Encounter (Signed)
 Returned call to [facility] informing PT is for after surgery

## 2024-02-26 NOTE — Telephone Encounter (Signed)
 Amrie from [facility] called wanting to know if the pt is supposed to have therapy before or after the surgery. Call back number is [phone].

## 2024-03-29 ENCOUNTER — Ambulatory Visit (HOSPITAL_BASED_OUTPATIENT_CLINIC_OR_DEPARTMENT_OTHER): Admit: 2024-03-29 | Admitting: Orthopaedic Surgery

## 2024-03-29 ENCOUNTER — Encounter (HOSPITAL_BASED_OUTPATIENT_CLINIC_OR_DEPARTMENT_OTHER): Payer: Self-pay

## 2024-04-02 ENCOUNTER — Ambulatory Visit (HOSPITAL_BASED_OUTPATIENT_CLINIC_OR_DEPARTMENT_OTHER): Payer: Self-pay | Admitting: Physical Therapy

## 2024-04-06 ENCOUNTER — Encounter (HOSPITAL_BASED_OUTPATIENT_CLINIC_OR_DEPARTMENT_OTHER): Payer: Self-pay | Admitting: Physical Therapy

## 2024-04-13 ENCOUNTER — Encounter (HOSPITAL_BASED_OUTPATIENT_CLINIC_OR_DEPARTMENT_OTHER): Admitting: Orthopaedic Surgery

## 2024-04-13 ENCOUNTER — Encounter (HOSPITAL_BASED_OUTPATIENT_CLINIC_OR_DEPARTMENT_OTHER): Payer: Self-pay | Admitting: Physical Therapy

## 2024-04-20 ENCOUNTER — Encounter (HOSPITAL_BASED_OUTPATIENT_CLINIC_OR_DEPARTMENT_OTHER): Payer: Self-pay | Admitting: Physical Therapy

## 2024-04-25 ENCOUNTER — Encounter (HOSPITAL_BASED_OUTPATIENT_CLINIC_OR_DEPARTMENT_OTHER): Payer: Self-pay | Admitting: Physical Therapy

## 2024-04-27 ENCOUNTER — Encounter (HOSPITAL_BASED_OUTPATIENT_CLINIC_OR_DEPARTMENT_OTHER): Payer: Self-pay

## 2024-05-02 ENCOUNTER — Encounter (HOSPITAL_BASED_OUTPATIENT_CLINIC_OR_DEPARTMENT_OTHER): Payer: Self-pay | Admitting: Physical Therapy

## 2024-05-04 ENCOUNTER — Encounter (HOSPITAL_BASED_OUTPATIENT_CLINIC_OR_DEPARTMENT_OTHER): Payer: Self-pay

## 2024-05-09 ENCOUNTER — Encounter (HOSPITAL_BASED_OUTPATIENT_CLINIC_OR_DEPARTMENT_OTHER): Payer: Self-pay | Admitting: Physical Therapy

## 2024-05-11 ENCOUNTER — Encounter (HOSPITAL_BASED_OUTPATIENT_CLINIC_OR_DEPARTMENT_OTHER): Payer: Self-pay

## 2024-09-13 ENCOUNTER — Ambulatory Visit: Admitting: Adult Health
